# Patient Record
Sex: Female | Born: 1960 | Race: White | Hispanic: No | Marital: Single | Smoking: Never smoker
Health system: Southern US, Community
[De-identification: ages and names within clinical notes are randomized; demographics above are authoritative.]

## PROBLEM LIST (undated history)

## (undated) DIAGNOSIS — M810 Age-related osteoporosis without current pathological fracture: Secondary | ICD-10-CM

## (undated) DIAGNOSIS — E079 Disorder of thyroid, unspecified: Secondary | ICD-10-CM

## (undated) HISTORY — PX: ABDOMINAL HYSTERECTOMY: SHX81

## (undated) HISTORY — PX: LASIK: SHX215

## (undated) HISTORY — DX: Age-related osteoporosis without current pathological fracture: M81.0

## (undated) HISTORY — PX: FOOT SURGERY: SHX648

## (undated) HISTORY — PX: SHOULDER SURGERY: SHX246

---

## 2015-03-12 ENCOUNTER — Encounter (HOSPITAL_COMMUNITY): Payer: Self-pay | Admitting: Family Medicine

## 2015-03-12 ENCOUNTER — Emergency Department (HOSPITAL_COMMUNITY)
Admission: EM | Admit: 2015-03-12 | Discharge: 2015-03-12 | Discharge status: Against Medical Advice | Payer: Commercial Indemnity | Attending: Emergency Medicine | Admitting: Emergency Medicine

## 2015-03-12 DIAGNOSIS — R109 Unspecified abdominal pain: Secondary | ICD-10-CM | POA: Insufficient documentation

## 2015-03-12 HISTORY — DX: Disorder of thyroid, unspecified: E07.9

## 2015-03-12 NOTE — ED Notes (Signed)
Pt came to the desk and states that the chicken that was on her ulcer has dislodged and has come out. States that she feels fine and is going to go home. Denies difficulty breathing.

## 2015-03-12 NOTE — ED Notes (Signed)
Pt here for the feeling of something stuck in her esophagus. sts the same thing before with an ulcer. sts when she drinks water in comes back up. sts last time they had to put her to sleep and get the food out.

## 2016-02-20 ENCOUNTER — Encounter: Payer: Self-pay | Admitting: Podiatry

## 2016-02-20 ENCOUNTER — Ambulatory Visit (INDEPENDENT_AMBULATORY_CARE_PROVIDER_SITE_OTHER): Payer: Managed Care, Other (non HMO)

## 2016-02-20 ENCOUNTER — Ambulatory Visit (INDEPENDENT_AMBULATORY_CARE_PROVIDER_SITE_OTHER): Payer: Managed Care, Other (non HMO) | Admitting: Podiatry

## 2016-02-20 VITALS — BP 117/68 | HR 74 | Resp 16 | Ht 67.0 in | Wt 309.0 lb

## 2016-02-20 DIAGNOSIS — M79672 Pain in left foot: Secondary | ICD-10-CM | POA: Diagnosis not present

## 2016-02-20 DIAGNOSIS — M722 Plantar fascial fibromatosis: Secondary | ICD-10-CM

## 2016-02-20 DIAGNOSIS — M79671 Pain in right foot: Secondary | ICD-10-CM | POA: Diagnosis not present

## 2016-02-20 MED ORDER — TRIAMCINOLONE ACETONIDE 10 MG/ML IJ SUSP
10.0000 mg | Freq: Once | INTRAMUSCULAR | Status: AC
  Administered 2016-02-20: 10 mg

## 2016-02-20 MED ORDER — DICLOFENAC SODIUM 75 MG PO TBEC: 75.0000 mg | DELAYED_RELEASE_TABLET | Freq: Two times a day (BID) | ORAL | 2 refills | Status: DC

## 2016-02-20 NOTE — Progress Notes (Signed)
   Subjective:    Patient ID: Heather Farley, female    DOB: 1960/08/21, 55 y.o.   MRN: [id number]  HPI  Chief Complaint  Patient presents with  . Foot Pain    bilateral heels; "pt states pain is worse in the right" x a few mo       Review of Systems  All other systems reviewed and are negative.      Objective:   Physical Exam        Assessment & Plan:

## 2016-02-20 NOTE — Progress Notes (Signed)
Subjective:     Patient ID: Heather Farley, female   DOB: [birth year], 55 y.o.   MRN: [id number]  HPI patient states she's having a lot of pain in her heels right over left and it's been going on for a while and she's had previous history of condition with night splints that she's utilized in the past   Review of Systems  All other systems reviewed and are negative.      Objective:   Physical Exam  Constitutional: She is oriented to person, place, and time.  Cardiovascular: Intact distal pulses.   Genitourinary: Vaginal discharge: neurovascular status intact muscle strength adequate range of motion within normal limits with patient found to have exquisite discomfort plantar heel right over left with inflammation fluid medial band and moderate depression of the arch. Patient's found   Musculoskeletal: Normal range of motion.  Neurological: She is oriented to person, place, and time.  Skin: Skin is warm.  Nursing note and vitals reviewed.      Assessment:     Acute plantar fasciitis right over left of several months duration    Plan:     H&P x-rays reviewed and injected the plantar fascia bilateral 3 mg Kenalog 5 mg Xylocaine and dispensed night splint with instructions on usage and prescription for diclofenac 75 mg twice a day was dispensed and prescribed. Patient was given instructions on how to stretch properly will be seen back in 2 weeks to recheck  X-ray report was negative for signs of fracture did indicate large spur right over left

## 2016-02-20 NOTE — Patient Instructions (Signed)

## 2016-03-05 ENCOUNTER — Ambulatory Visit (INDEPENDENT_AMBULATORY_CARE_PROVIDER_SITE_OTHER): Payer: Managed Care, Other (non HMO) | Admitting: Podiatry

## 2016-03-05 ENCOUNTER — Encounter: Payer: Self-pay | Admitting: Podiatry

## 2016-03-05 DIAGNOSIS — M722 Plantar fascial fibromatosis: Secondary | ICD-10-CM

## 2016-03-05 MED ORDER — TRIAMCINOLONE ACETONIDE 10 MG/ML IJ SUSP
10.0000 mg | Freq: Once | INTRAMUSCULAR | Status: AC
  Administered 2016-03-05: 10 mg

## 2016-03-06 NOTE — Progress Notes (Signed)
Subjective:     Patient ID: Heather Farley, female   DOB: Nov 08, 1960, 55 y.o.   MRN: [id number]  HPI patient states I'm improved but still having discomfort   Review of Systems     Objective:   Physical Exam Neurovascular status intact with discomfort in the heel at the insertion tendon into the calcaneus with moderate improvement    Assessment:     Plantar fasciitis improved but still present    Plan:     Reinjected 3 mg Kenalog 5 mill grams Xylocaine advised on physical therapy ice and supportive shoe

## 2016-04-07 ENCOUNTER — Ambulatory Visit: Payer: Managed Care, Other (non HMO) | Admitting: Podiatry

## 2016-05-27 ENCOUNTER — Other Ambulatory Visit: Payer: Self-pay | Admitting: Gastroenterology

## 2016-05-27 DIAGNOSIS — R10814 Left lower quadrant abdominal tenderness: Secondary | ICD-10-CM

## 2016-05-28 ENCOUNTER — Ambulatory Visit
Admission: RE | Admit: 2016-05-28 | Discharge: 2016-05-28 | Disposition: A | Discharge status: Home / Self Care | Payer: Managed Care, Other (non HMO) | Source: Ambulatory Visit | Attending: Gastroenterology | Admitting: Gastroenterology

## 2016-05-28 DIAGNOSIS — R10814 Left lower quadrant abdominal tenderness: Secondary | ICD-10-CM

## 2016-05-28 MED ORDER — IOPAMIDOL (ISOVUE-300) INJECTION 61%
100.0000 mL | Freq: Once | INTRAVENOUS | Status: AC | PRN
  Administered 2016-05-28: 100 mL via INTRAVENOUS

## 2016-05-30 ENCOUNTER — Encounter: Payer: Self-pay | Admitting: Podiatry

## 2016-05-30 ENCOUNTER — Ambulatory Visit (INDEPENDENT_AMBULATORY_CARE_PROVIDER_SITE_OTHER): Payer: Managed Care, Other (non HMO) | Admitting: Podiatry

## 2016-05-30 VITALS — BP 108/63 | HR 87 | Resp 16

## 2016-05-30 DIAGNOSIS — M722 Plantar fascial fibromatosis: Secondary | ICD-10-CM | POA: Diagnosis not present

## 2016-05-30 MED ORDER — TRIAMCINOLONE ACETONIDE 10 MG/ML IJ SUSP
10.0000 mg | Freq: Once | INTRAMUSCULAR | Status: AC
  Administered 2016-05-30: 10 mg

## 2016-06-01 NOTE — Progress Notes (Signed)
Subjective:     Patient ID: Heather Farley, female   DOB: [birth year], 55 y.o.   MRN: [id number]  HPI patient states she's having a flareup in her heel   Review of Systems     Objective:   Physical Exam Neurovascular status intact with discomfort noted upon palpation to the plantar heel but it is doing okay overall    Assessment:     Plantar fasciitis right    Plan:     Injected the right plantar fashion 3 mg Kenalog 5 mill grams Xylocaine and instructed on physical therapy and reappoint to recheck

## 2016-06-25 ENCOUNTER — Ambulatory Visit (INDEPENDENT_AMBULATORY_CARE_PROVIDER_SITE_OTHER): Payer: Managed Care, Other (non HMO)

## 2016-06-25 DIAGNOSIS — M722 Plantar fascial fibromatosis: Secondary | ICD-10-CM

## 2016-06-25 NOTE — Patient Instructions (Signed)

## 2016-07-11 NOTE — Progress Notes (Signed)
Patient presents for orthotic pick up.  Verbal and written break in and wear instructions given.  Patient will follow up in 4 weeks if symptoms worsen or fail to improve. 

## 2016-07-21 ENCOUNTER — Ambulatory Visit: Payer: Managed Care, Other (non HMO) | Admitting: Podiatry

## 2016-07-24 ENCOUNTER — Encounter: Payer: Self-pay | Admitting: Podiatry

## 2016-07-24 ENCOUNTER — Ambulatory Visit (INDEPENDENT_AMBULATORY_CARE_PROVIDER_SITE_OTHER): Payer: Managed Care, Other (non HMO) | Admitting: Podiatry

## 2016-07-24 DIAGNOSIS — M722 Plantar fascial fibromatosis: Secondary | ICD-10-CM | POA: Diagnosis not present

## 2016-07-24 NOTE — Patient Instructions (Signed)
Pre-Operative Instructions  Congratulations, you have decided to take an important step to improving your quality of life.  You can be assured that the doctors of [facility] will be with you every step of the way.  1. Plan to be at the surgery center/hospital at least 1 (one) hour prior to your scheduled time unless otherwise directed by the surgical center/hospital staff.  You must have a responsible adult accompany you, remain during the surgery and drive you home.  Make sure you have directions to the surgical center/hospital and know how to get there on time. 2. For hospital based surgery you will need to obtain a history and physical form from your family physician within 1 month prior to the date of surgery- we will give you a form for you primary physician.  3. We make every effort to accommodate the date you request for surgery.  There are however, times where surgery dates or times have to be moved.  We will contact you as soon as possible if a change in schedule is required.   4. No Aspirin/Ibuprofen for one week before surgery.  If you are on aspirin, any non-steroidal anti-inflammatory medications (Mobic, Aleve, Ibuprofen) you should stop taking it 7 days prior to your surgery.  You make take Tylenol  For pain prior to surgery.  5. Medications- If you are taking daily heart and blood pressure medications, seizure, reflux, allergy, asthma, anxiety, pain or diabetes medications, make sure the surgery center/hospital is aware before the day of surgery so they may notify you which medications to take or avoid the day of surgery. 6. No food or drink after midnight the night before surgery unless directed otherwise by surgical center/hospital staff. 7. No alcoholic beverages 24 hours prior to surgery.  No smoking 24 hours prior to or 24 hours after surgery. 8. Wear loose pants or shorts- loose enough to fit over bandages, boots, and casts. 9. No slip on shoes, sneakers are best. 10. Bring  your boot with you to the surgery center/hospital.  Also bring crutches or a walker if your physician has prescribed it for you.  If you do not have this equipment, it will be provided for you after surgery. 11. If you have not been contracted by the surgery center/hospital by the day before your surgery, call to confirm the date and time of your surgery. 12. Leave-time from work may vary depending on the type of surgery you have.  Appropriate arrangements should be made prior to surgery with your employer. 13. Prescriptions will be provided immediately following surgery by your doctor.  Have these filled as soon as possible after surgery and take the medication as directed. 14. Remove nail polish on the operative foot. 15. Wash the night before surgery.  The night before surgery wash the foot and leg well with the antibacterial soap provided and water paying special attention to beneath the toenails and in between the toes.  Rinse thoroughly with water and dry well with a towel.  Perform this wash unless told not to do so by your physician.  Enclosed: 1 Ice pack (please put in freezer the night before surgery)   1 Hibiclens skin cleaner   Pre-op Instructions  If you have any questions regarding the instructions, do not hesitate to call our office.  [place]: [address] [place], [place] [address] [phone]  [place]: [address] [place], [place] [address] [phone]  [place]: [address]  [place], [place] [address] [phone]   Dr.   Nyisha Clippard DPM, Dr. Matthew Wagoner DPM, Dr. M. Todd Hyatt DPM, Dr. Titorya Stover DPM 

## 2016-07-24 NOTE — Progress Notes (Signed)
Subjective:     Patient ID: Heather Farley, female   DOB: 1960/07/17, 56 y.o.   MRN: [id number]  HPI patient states that both heels have been killing her with the left worse than the right with inflammation and fluid around the medial band. States that she's not receiving relief from the injection treatments and that she has worn orthotics as tried braces she's tried reduced activity and anti-inflammatories without relief and she is tired of the significant discomfort   Review of Systems     Objective:   Physical Exam Neurovascular status intact negative Homans sign was noted with patient found to have severe discomfort plantar fascial left and right heel at the insertional point of the tendon into the calcaneus with fluid buildup around the medial band. Patient states it is localized to this area and has been intense in its origin    Assessment:     Severe plantar fasciitis of a chronic nature heel left over right insertional point medial band    Plan:     H&P condition reviewed at great length. Due to long-standing nature and failure to respond to numerous conservative treatments patient has opted for surgical intervention. I allowed patient to read consent form for correction of left and I reviewed alternative treatments complications the fact there is no guarantee this will solve the problem and other complications that can occur with release of the plantar fascia. Patient wants surgery signed consent form and understands recovery can take approximate 6 months and is dispensed air fracture walker with all instructions on usage. Patient is [facility] and is encouraged to call with any questions

## 2016-07-30 ENCOUNTER — Encounter: Payer: Self-pay | Admitting: Podiatry

## 2016-07-30 DIAGNOSIS — M722 Plantar fascial fibromatosis: Secondary | ICD-10-CM | POA: Diagnosis not present

## 2016-08-01 ENCOUNTER — Telehealth: Payer: Self-pay

## 2016-08-01 NOTE — Telephone Encounter (Signed)
Poke with pt regarding post op status, she states her pain has increased today and she is regularly taking Rx pain medication. Advised her to loosen ace wrap, leaving gauze in place. Layer ibuprofen between Rx pain medication and tried to ween off pain medication as tolerated. Advised to continue with boot usage, ice and elevation. She verbalized understanding of all this and is to call with any acute symptom changes

## 2016-08-05 NOTE — Progress Notes (Signed)
DOS 01.31.2018 Endoscopic Release Med. Band Left Heel.

## 2016-08-07 ENCOUNTER — Ambulatory Visit (INDEPENDENT_AMBULATORY_CARE_PROVIDER_SITE_OTHER): Payer: Managed Care, Other (non HMO) | Admitting: Podiatry

## 2016-08-07 ENCOUNTER — Encounter: Payer: Self-pay | Admitting: Podiatry

## 2016-08-07 VITALS — Temp 97.1°F

## 2016-08-07 DIAGNOSIS — M722 Plantar fascial fibromatosis: Secondary | ICD-10-CM

## 2016-08-07 NOTE — Progress Notes (Signed)
She presents today for follow-up of her endoscopic plantar fasciotomy 1 week. She denies fever chills nausea vomiting muscle aches and pains. States it is doing very well. She presents today utilizing a cam walker. States that she has not been sleeping in a Cam Walker nor has she been sleeping in a night splint. She denies chest pain shortness breath.  Objective: Vital signs are stable alert and oriented 3. Pulses are strongly palpable. Neurologic sensorium is intact. Deep tendon reflexes are intact. Muscle strength is normal. Sutures are intact to the medial and lateral ports of the left heel. There is no signs of infection in that there is no erythema edema cellulitis drainage or odor. There is small dehiscence of the right incision site or I will have taken the stitches out today.  Assessment: Well-healing surgical foot left.  Plan: I placed her in a compression anklet and a Darco shoe will follow-up with her in a couple of weeks to see Dr. Charlsie Merlesregal.

## 2016-08-13 ENCOUNTER — Encounter: Payer: Self-pay | Admitting: Podiatry

## 2016-08-13 ENCOUNTER — Ambulatory Visit (INDEPENDENT_AMBULATORY_CARE_PROVIDER_SITE_OTHER): Payer: Self-pay | Admitting: Podiatry

## 2016-08-13 VITALS — BP 119/67 | HR 74 | Resp 16

## 2016-08-13 DIAGNOSIS — M722 Plantar fascial fibromatosis: Secondary | ICD-10-CM

## 2016-08-14 NOTE — Progress Notes (Signed)
Subjective:     Patient ID: Heather Farley, female   DOB: [birth year], 56 y.o.   MRN: [id number]  HPI patient presents stating I'm doing well with my left foot with minimal discomfort and wearing the boot without pain   Review of Systems     Objective:   Physical Exam Neurovascular status intact negative Homan sign was noted with patient's wound edges well coapted medial lateral side left heel    Assessment:     Doing well post endoscopic surgery left    Plan:     Stitches removed wound edges coapted well and advised a continued boot usage and stretch and reappoint to recheck 4 weeks or earlier if needed

## 2016-09-10 ENCOUNTER — Telehealth: Payer: Self-pay | Admitting: Podiatry

## 2016-09-10 NOTE — Telephone Encounter (Signed)
Pt called asking what other doctors could remove heel spurs.She asid Dr Charlsie Merlesegal did surgery on her plantar fascitis and said he could not remove at same time.Please advise

## 2016-09-11 NOTE — Telephone Encounter (Signed)
"  I want to have my heel spur taken off on my left foot.  Dr. Charlsie Merlesegal said he doesn't do that, he said he hasn't done that in 25 years.  He did surgery for plantar fasciitis on my right foot before.  I want the spur removed this time on my right foot.  I like Dr. Charlsie Merlesegal but I know what I want."  Normally surgery is not done to remove the heel spur.  Relief normally comes from doing surgery on the Plantar Fascia because it rubs the heel and the friction causes the spur to develop.  "I understand all that.  I just want to know if there is someone in your office that can do this surgery."  I will have to ask Dr. Charlsie Merlesegal who he recommends.  "I have been told this for two days.  I have not heard anything yet.  Ciria told me someone would call me back on yesterday but no one called.  I am not trying to give you all a hard time.  I respect Dr. Charlsie Merlesegal and have no problem against him.  I just want it done and if you all can't, I will go somewhere else."  I will personally give the message to Dr. Charlsie Merlesegal and give you a call back with a response.  "Okay, I'll be waiting."

## 2016-09-11 NOTE — Telephone Encounter (Signed)
I left patient a message that Dr. Charlsie Merlesegal does not do that procedure.  He said he can refer you to Dr. Ardelle AntonWagoner.  Please give us a call to schedule an appointment for a consultation with Dr. Ovid CurdMatthew Wagoner.

## 2017-09-02 ENCOUNTER — Other Ambulatory Visit: Payer: Self-pay | Admitting: Nurse Practitioner

## 2017-09-02 DIAGNOSIS — Z139 Encounter for screening, unspecified: Secondary | ICD-10-CM

## 2017-09-09 ENCOUNTER — Ambulatory Visit
Admission: RE | Admit: 2017-09-09 | Discharge: 2017-09-09 | Disposition: A | Discharge status: Home / Self Care | Payer: Managed Care, Other (non HMO) | Source: Ambulatory Visit | Attending: Nurse Practitioner | Admitting: Nurse Practitioner

## 2017-09-09 DIAGNOSIS — Z139 Encounter for screening, unspecified: Secondary | ICD-10-CM

## 2018-03-11 ENCOUNTER — Encounter (HOSPITAL_COMMUNITY): Payer: Self-pay

## 2018-03-11 ENCOUNTER — Ambulatory Visit (HOSPITAL_COMMUNITY)
Admission: EM | Admit: 2018-03-11 | Discharge: 2018-03-11 | Disposition: A | Discharge status: Home / Self Care | Payer: Managed Care, Other (non HMO) | Attending: Family Medicine | Admitting: Family Medicine

## 2018-03-11 DIAGNOSIS — K5732 Diverticulitis of large intestine without perforation or abscess without bleeding: Secondary | ICD-10-CM

## 2018-03-11 DIAGNOSIS — E039 Hypothyroidism, unspecified: Secondary | ICD-10-CM

## 2018-03-11 MED ORDER — METRONIDAZOLE 500 MG PO TABS: 500.0000 mg | ORAL_TABLET | Freq: Two times a day (BID) | ORAL | 0 refills | Status: DC

## 2018-03-11 MED ORDER — CIPROFLOXACIN HCL 500 MG PO TABS: 500.0000 mg | ORAL_TABLET | Freq: Two times a day (BID) | ORAL | 0 refills | Status: DC

## 2018-03-11 NOTE — ED Provider Notes (Signed)
[facility]    CSN: [id number] Arrival date & time: 03/11/18  0841     History   Chief Complaint Chief Complaint  Patient presents with  . Abdominal Pain    Left lower Side    HPI Tanay Massiah is a 57 y.o. female.   HPI  History of diverticulosis.  Has had diverticulitis.  Is here with LLQ pain for 3 days, acutely worse since midnight.  Nausea, no vomiting.  A few loose stools, no mucous or blood.  No fever or chills.  Has had a hysterectomy and appy.   No travel No new foods No exposure to sick contacts  Past Medical History:  Diagnosis Date  . Thyroid disease     Patient Active Problem List   Diagnosis Date Noted  . Hypothyroidism 03/11/2018    Past Surgical History:  Procedure Laterality Date  . ABDOMINAL HYSTERECTOMY      OB History   None      Home Medications    Prior to Admission medications   Medication Sig Start Date End Date Taking? Authorizing Provider  ciprofloxacin (CIPRO) 500 MG tablet Take 1 tablet (500 mg total) by mouth 2 (two) times daily. 03/11/18   Eustace Moore, MD  diclofenac (VOLTAREN) 75 MG EC tablet Take 1 tablet (75 mg total) by mouth 2 (two) times daily. 02/20/16   Lenn Sink, DPM  metroNIDAZOLE (FLAGYL) 500 MG tablet Take 1 tablet (500 mg total) by mouth 2 (two) times daily. 03/11/18   Eustace Moore, MD  NATURE-THROID 130 MG tablet Take 130 mg by mouth daily. 02/01/16   [provider]    Family History History reviewed. No pertinent family history.  Social History Social History   Tobacco Use  . Smoking status: Never Smoker  . Smokeless tobacco: Never Used  Substance Use Topics  . Alcohol use: No  . Drug use: No     Allergies   Chicken allergy; Soy allergy; Codeine; and Hydrocodone-acetaminophen   Review of Systems Review of Systems  Constitutional: Negative for chills and fever.  HENT: Negative for ear pain and sore throat.   Eyes: Negative for pain and visual disturbance.    Respiratory: Negative for cough and shortness of breath.   Cardiovascular: Negative for chest pain and palpitations.  Gastrointestinal: Positive for abdominal pain, diarrhea and nausea. Negative for blood in stool and vomiting.  Genitourinary: Negative for dysuria and hematuria.  Musculoskeletal: Negative for arthralgias and back pain.  Skin: Negative for color change and rash.  Neurological: Negative for seizures and syncope.  All other systems reviewed and are negative.    Physical Exam Triage Vital Signs ED Triage Vitals  Enc Vitals Group     BP 03/11/18 0902 (!) 158/89     Pulse Rate 03/11/18 0902 (!) 112     Resp 03/11/18 0902 18     Temp 03/11/18 0902 98.9 F (37.2 C)     Temp Source 03/11/18 0902 Oral     SpO2 03/11/18 0902 98 %     Weight --      Height --      Head Circumference --      Peak Flow --      Pain Score 03/11/18 0906 7     Pain Loc --      Pain Edu? --      Excl. in GC? --    No data found.  Updated Vital Signs BP (!) 158/89 (BP Location: Left Arm)  Pulse (!) 112   Temp 98.9 F (37.2 C) (Oral)   Resp 18   SpO2 98%      Physical Exam  Constitutional: She appears well-developed and well-nourished. No distress.  HENT:  Head: Normocephalic and atraumatic.  Mouth/Throat: Oropharynx is clear and moist.  Eyes: Pupils are equal, round, and reactive to light. Conjunctivae are normal.  Neck: Normal range of motion.  Cardiovascular: Normal rate, regular rhythm and normal heart sounds.  Pulmonary/Chest: Effort normal and breath sounds normal. No respiratory distress. She has no rales.  Abdominal: Soft. Bowel sounds are normal. She exhibits no distension. There is no hepatosplenomegaly. There is tenderness in the left lower quadrant. There is no rigidity, no rebound and no guarding.  Normal active bowel sounds.  Acute tenderness to deep palpation of the left lower quadrant.  No guarding or rebound.  Remainder of abdominal exam is normal.  No CVAT.  UA  normal.  Musculoskeletal: Normal range of motion. She exhibits no edema.  Neurological: She is alert.  Skin: Skin is warm and dry.  Psychiatric: She has a normal mood and affect. Her behavior is normal.     UC Treatments / Results  Labs (all labs ordered are listed, but only abnormal results are displayed) Labs Reviewed - No data to display  EKG None  Radiology No results found.  Procedures Procedures (including critical care time)  Medications Ordered in UC Medications - No data to display  Initial Impression / Assessment and Plan / UC Course  I have reviewed the triage vital signs and the nursing notes.  Pertinent labs & imaging results that were available during my care of the patient were reviewed by me and considered in my medical decision making (see chart for details).      Final Clinical Impressions(s) / UC Diagnoses   Final diagnoses:  Diverticulitis of colon     Discharge Instructions     For diverticulitis prevention eat a high fiber diet For current flare - take antibiotics for 5 days then assess your response.  May stop when you feel better Be sure to continue your probiotic Follow up with your PCP    ED Prescriptions    Medication Sig Dispense Auth. Provider   ciprofloxacin (CIPRO) 500 MG tablet Take 1 tablet (500 mg total) by mouth 2 (two) times daily. 20 tablet Eustace MooreNelson, Zahria Ding Sue, MD   metroNIDAZOLE (FLAGYL) 500 MG tablet Take 1 tablet (500 mg total) by mouth 2 (two) times daily. 20 tablet Eustace MooreNelson, Teddie Mehta Sue, MD     Controlled Substance Prescriptions [place] Controlled Substance Registry consulted? Not Applicable   Eustace MooreNelson, Tsuyako Jolley Sue, MD 03/11/18 1013

## 2018-03-11 NOTE — ED Triage Notes (Signed)
Pt presents with lower left middle side abdominal pain that hurts more with breathing out . Complains of no other symptoms.

## 2018-03-11 NOTE — Discharge Instructions (Signed)
For diverticulitis prevention eat a high fiber diet For current flare - take antibiotics for 5 days then assess your response.  May stop when you feel better Be sure to continue your probiotic Follow up with your PCP

## 2018-11-17 ENCOUNTER — Other Ambulatory Visit: Payer: Self-pay | Admitting: Nurse Practitioner

## 2018-11-17 DIAGNOSIS — Z1231 Encounter for screening mammogram for malignant neoplasm of breast: Secondary | ICD-10-CM

## 2018-11-19 ENCOUNTER — Ambulatory Visit
Admission: RE | Admit: 2018-11-19 | Discharge: 2018-11-19 | Disposition: A | Discharge status: Home / Self Care | Payer: Managed Care, Other (non HMO) | Source: Ambulatory Visit | Attending: Nurse Practitioner | Admitting: Nurse Practitioner

## 2018-11-19 ENCOUNTER — Other Ambulatory Visit: Payer: Self-pay

## 2018-11-19 DIAGNOSIS — Z1231 Encounter for screening mammogram for malignant neoplasm of breast: Secondary | ICD-10-CM

## 2018-11-24 ENCOUNTER — Other Ambulatory Visit: Payer: Self-pay

## 2018-11-24 ENCOUNTER — Ambulatory Visit (HOSPITAL_COMMUNITY)
Admission: EM | Admit: 2018-11-24 | Discharge: 2018-11-24 | Disposition: A | Discharge status: Home / Self Care | Payer: Managed Care, Other (non HMO) | Attending: Family Medicine | Admitting: Family Medicine

## 2018-11-24 ENCOUNTER — Encounter (HOSPITAL_COMMUNITY): Payer: Self-pay

## 2018-11-24 DIAGNOSIS — S50862A Insect bite (nonvenomous) of left forearm, initial encounter: Secondary | ICD-10-CM

## 2018-11-24 DIAGNOSIS — W57XXXA Bitten or stung by nonvenomous insect and other nonvenomous arthropods, initial encounter: Secondary | ICD-10-CM

## 2018-11-24 DIAGNOSIS — L918 Other hypertrophic disorders of the skin: Secondary | ICD-10-CM | POA: Diagnosis not present

## 2018-11-24 MED ORDER — TRIAMCINOLONE ACETONIDE 0.1 % EX CREA: 1.0000 "application " | TOPICAL_CREAM | Freq: Two times a day (BID) | CUTANEOUS | 0 refills | Status: DC

## 2018-11-24 NOTE — ED Triage Notes (Signed)
Patient presents to Urgent Care with complaints of itchy area on left forearm since 3 days ago. Patient reports she has done a lot of "Google" searching and is scared she has skin cancer.

## 2018-11-30 NOTE — ED Provider Notes (Signed)
[facility]   [id number] 11/24/18 Arrival Time: 1652  ASSESSMENT & PLAN:  1. Insect bite of left forearm, initial encounter   2. Inflamed skin tag    For L forearm: Meds ordered this encounter  Medications  . triamcinolone cream (KENALOG) 0.1 %    Sig: Apply 1 application topically 2 (two) times daily.    Dispense:  30 g    Refill:  0   Skin tag removal: Skin tag and surrounding skin cleaned with alcohol. 1 cc of 1% plain lidocaine for local anesthesia. Using #10 blade skin tag removed. No complications. Minimal bleeding; controlled with silver nitrate application. Bandaged. Simple wound care instructions. Declines pathology.  Will follow up with PCP or here if worsening or failing to improve as anticipated. Reviewed expectations re: course of current medical issues. Questions answered. Outlined signs and symptoms indicating need for more acute intervention. Patient verbalized understanding. After Visit Summary given.   SUBJECTIVE:  Heather Farley is a 58 y.o. female who presents with a skin complaint.   1) Location:"itchy area" on left forearm Onset: abrupt Duration: 3 days Associated pruritis? moderate Associated pain? none Progression: stable  Drainage? No  Known trigger? questions insect bite/sting New soaps/lotions/topicals/detergents/environmental exposures? No Contacts with similar? No Recent travel? No  Other associated symptoms: none Therapies tried thus far: none Arthralgia or myalgia? none Recent illness? none Fever? none No specific aggravating or alleviating factors reported.  2) Location: L posterior inferior neck; "something on my skin that gets really sore and inflamed sometimes" Onset: gradual Duration: inflamed and sore for the past several days Associated pruritis? none Associated pain? mild Progression: stable  Drainage? No  Feels that her shirt irritates area. Other associated symptoms: none Therapies tried thus far: none  Arthralgia or myalgia? none Fever? none  ROS: As per HPI. All other systems negative.   OBJECTIVE: Vitals:   11/24/18 1711 11/24/18 1713  BP: (!) 175/121 (!) 129/93  Pulse: (!) 18   Resp: 18   Temp: 98.6 F (37 C)   TempSrc: Oral   SpO2: 100%     General appearance: alert; no distress Lungs: clear to auscultation bilaterally Heart: regular rate and rhythm Extremities: no edema Skin: warm and dry; L forearm with approx 1 cm slight induration and erythema; no drainage or bleeding; no fluctuance; L posterior inferior neck with inflamed skin tag, with no signs of infection Psychological: alert and cooperative; normal mood and affect  Allergies  Allergen Reactions  . Chicken Allergy   . Soy Allergy   . Codeine Nausea And Vomiting  . Hydrocodone-Acetaminophen Itching    Face was flushed    Past Medical History:  Diagnosis Date  . Thyroid disease    Social History   Socioeconomic History  . Marital status: Single    Spouse name: Not on file  . Number of children: Not on file  . Years of education: Not on file  . Highest education level: Not on file  Occupational History  . Not on file  Social Needs  . Financial resource strain: Not on file  . Food insecurity:    Worry: Not on file    Inability: Not on file  . Transportation needs:    Medical: Not on file    Non-medical: Not on file  Tobacco Use  . Smoking status: Never Smoker  . Smokeless tobacco: Never Used  Substance and Sexual Activity  . Alcohol use: No  . Drug use: No  . Sexual activity: Not on file  Lifestyle  . Physical activity:    Days per week: Not on file    Minutes per session: Not on file  . Stress: Not on file  Relationships  . Social connections:    Talks on phone: Not on file    Gets together: Not on file    Attends religious service: Not on file    Active member of club or organization: Not on file    Attends meetings of clubs or organizations: Not on file    Relationship status: Not  on file  . Intimate partner violence:    Fear of current or ex partner: Not on file    Emotionally abused: Not on file    Physically abused: Not on file    Forced sexual activity: Not on file  Other Topics Concern  . Not on file  Social History Narrative  . Not on file   Family History  Problem Relation Age of Onset  . Healthy Mother   . Healthy Father    Past Surgical History:  Procedure Laterality Date  . ABDOMINAL HYSTERECTOMY       Mardella LaymanHagler, Brentney Goldbach, MD 11/30/18 1123

## 2019-04-18 ENCOUNTER — Ambulatory Visit: Payer: Self-pay

## 2019-04-18 ENCOUNTER — Other Ambulatory Visit: Payer: Self-pay

## 2019-04-18 DIAGNOSIS — Z021 Encounter for pre-employment examination: Secondary | ICD-10-CM

## 2019-04-18 NOTE — Progress Notes (Signed)
Pre-employment drug screen collected using [employer] chain of custody form for account # [id number]. Specimen # [id number].  AMD

## 2019-05-12 ENCOUNTER — Telehealth: Payer: Self-pay | Admitting: General Practice

## 2019-05-12 DIAGNOSIS — S8002XA Contusion of left knee, initial encounter: Secondary | ICD-10-CM | POA: Diagnosis not present

## 2019-05-12 DIAGNOSIS — S8001XA Contusion of right knee, initial encounter: Secondary | ICD-10-CM | POA: Diagnosis not present

## 2019-05-12 NOTE — Telephone Encounter (Signed)
Knee injury would like guidance. She hit her knee while moving on a baby gate. I told her about [facility]. Not work related.

## 2019-05-13 ENCOUNTER — Other Ambulatory Visit: Payer: 59

## 2019-05-13 ENCOUNTER — Other Ambulatory Visit: Payer: Self-pay

## 2019-05-13 DIAGNOSIS — N951 Menopausal and female climacteric states: Secondary | ICD-10-CM

## 2019-05-13 NOTE — Progress Notes (Addendum)
Presents with order from Tonie Griffith, FNP-C of [place] ([place], [place]) for the following labs: Progesterone [facility] Total Testosterone Estradiol CMP  Phone:  [phone] Fax:  [phone]  Could not get Tonie Griffith, FNP-C to pull up in Epic.  Put orders in under Heather Morale, PA-C (Interim Provider).  Heather Farley states she saw FNP-C Reese about 6 weeks ago & had pellets inserted in buttock & these labs are to follow-up on the medication.  Informed Heather Farley that we will fax her results to her provider at the above fax number.  AMD  Medical records requested from PCP in regards to requested labs as patient has not yet been seen in this office. Received and labs cleared for processing

## 2019-05-20 LAB — COMPREHENSIVE METABOLIC PANEL
ALT: 18 IU/L (ref 0–32)
AST: 15 IU/L (ref 0–40)
Albumin/Globulin Ratio: 1.5 (ref 1.2–2.2)
Albumin: 3.9 g/dL (ref 3.8–4.9)
Alkaline Phosphatase: 108 IU/L (ref 39–117)
BUN/Creatinine Ratio: 9 (ref 9–23)
BUN: 7 mg/dL (ref 6–24)
Bilirubin Total: 0.3 mg/dL (ref 0.0–1.2)
CO2: 23 mmol/L (ref 20–29)
Calcium: 8.9 mg/dL (ref 8.7–10.2)
Chloride: 107 mmol/L — ABNORMAL HIGH (ref 96–106)
Creatinine, Ser: 0.76 mg/dL (ref 0.57–1.00)
GFR calc Af Amer: 101 mL/min/{1.73_m2} (ref >59)
GFR calc non Af Amer: 88 mL/min/{1.73_m2} (ref >59)
Globulin, Total: 2.6 g/dL (ref 1.5–4.5)
Glucose: 91 mg/dL (ref 65–99)
Potassium: 4.2 mmol/L (ref 3.5–5.2)
Sodium: 143 mmol/L (ref 134–144)
Total Protein: 6.5 g/dL (ref 6.0–8.5)

## 2019-05-20 LAB — PROGESTERONE: Progesterone: 1.4 ng/mL

## 2019-05-20 LAB — TESTOSTERONE, TOTAL, LC/MS/MS: Testosterone, total: 286.1 ng/dL

## 2019-05-20 LAB — FOLLICLE STIMULATING HORMONE: FSH: 26.2 m[IU]/mL

## 2019-05-20 LAB — ESTRADIOL: Estradiol: 58.3 pg/mL

## 2019-06-02 ENCOUNTER — Telehealth: Payer: Self-pay | Admitting: Physician Assistant

## 2019-06-02 ENCOUNTER — Telehealth: Payer: Self-pay

## 2019-06-02 NOTE — Telephone Encounter (Signed)
Heather Farley is a new [employer] employee effective October 2020. Her medical care is carried out at [facility] , [address], [place], [place] ,  [address]    Phone [phone]  Fax [phone]  Paper orders were received from her Provider Tonie Griffith FNP-C to include Progesterone, FSH,Total Testosterone, Estradiol, CMP, which were drawn on 05/13/2019. Results were faxed to ordering provider as available. See scanned image .  It is a benefit to the patient as [employer] employee to have labs drawn here, but all questions about the results should be forwarded to the ordering provider.

## 2019-06-02 NOTE — Telephone Encounter (Signed)
Lab results (Progesterone, FSH, Total Testosterone, Estradiol, CMP) from paper order from Tonie Griffith, FNP-C of [facility] , [address], [place], [place], [place]  [address] faxed to the attention of Tonie Griffith, FNP-C at [phone] on 06/02/2019.  AMD

## 2019-08-08 DIAGNOSIS — E559 Vitamin D deficiency, unspecified: Secondary | ICD-10-CM | POA: Diagnosis not present

## 2019-08-08 DIAGNOSIS — R234 Changes in skin texture: Secondary | ICD-10-CM | POA: Diagnosis not present

## 2019-08-08 DIAGNOSIS — R635 Abnormal weight gain: Secondary | ICD-10-CM | POA: Diagnosis not present

## 2019-08-08 DIAGNOSIS — R5383 Other fatigue: Secondary | ICD-10-CM | POA: Diagnosis not present

## 2019-08-08 DIAGNOSIS — E039 Hypothyroidism, unspecified: Secondary | ICD-10-CM | POA: Diagnosis not present

## 2019-08-08 DIAGNOSIS — M2550 Pain in unspecified joint: Secondary | ICD-10-CM | POA: Diagnosis not present

## 2019-08-08 DIAGNOSIS — N951 Menopausal and female climacteric states: Secondary | ICD-10-CM | POA: Diagnosis not present

## 2019-11-14 ENCOUNTER — Other Ambulatory Visit: Payer: Self-pay | Admitting: Nurse Practitioner

## 2019-11-14 DIAGNOSIS — Z1231 Encounter for screening mammogram for malignant neoplasm of breast: Secondary | ICD-10-CM

## 2019-11-21 ENCOUNTER — Other Ambulatory Visit: Payer: Self-pay

## 2019-11-21 ENCOUNTER — Other Ambulatory Visit: Payer: Self-pay | Admitting: *Deleted

## 2019-11-21 ENCOUNTER — Ambulatory Visit
Admission: RE | Admit: 2019-11-21 | Discharge: 2019-11-21 | Disposition: A | Discharge status: Home / Self Care | Payer: 59 | Source: Ambulatory Visit | Attending: Nurse Practitioner | Admitting: Nurse Practitioner

## 2019-11-21 ENCOUNTER — Other Ambulatory Visit: Payer: Self-pay | Admitting: Registered Nurse

## 2019-11-21 DIAGNOSIS — Z1231 Encounter for screening mammogram for malignant neoplasm of breast: Secondary | ICD-10-CM

## 2020-02-07 DIAGNOSIS — E039 Hypothyroidism, unspecified: Secondary | ICD-10-CM | POA: Diagnosis not present

## 2020-02-07 DIAGNOSIS — R7309 Other abnormal glucose: Secondary | ICD-10-CM | POA: Diagnosis not present

## 2020-02-07 DIAGNOSIS — R5383 Other fatigue: Secondary | ICD-10-CM | POA: Diagnosis not present

## 2020-02-07 DIAGNOSIS — E559 Vitamin D deficiency, unspecified: Secondary | ICD-10-CM | POA: Diagnosis not present

## 2020-02-07 DIAGNOSIS — R195 Other fecal abnormalities: Secondary | ICD-10-CM | POA: Diagnosis not present

## 2020-02-07 DIAGNOSIS — M2550 Pain in unspecified joint: Secondary | ICD-10-CM | POA: Diagnosis not present

## 2020-02-07 DIAGNOSIS — N951 Menopausal and female climacteric states: Secondary | ICD-10-CM | POA: Diagnosis not present

## 2020-02-07 DIAGNOSIS — R635 Abnormal weight gain: Secondary | ICD-10-CM | POA: Diagnosis not present

## 2020-02-08 ENCOUNTER — Other Ambulatory Visit: Payer: 59

## 2020-02-23 DIAGNOSIS — Z20828 Contact with and (suspected) exposure to other viral communicable diseases: Secondary | ICD-10-CM | POA: Diagnosis not present

## 2020-05-04 ENCOUNTER — Ambulatory Visit: Payer: Self-pay

## 2020-05-04 DIAGNOSIS — Z23 Encounter for immunization: Secondary | ICD-10-CM

## 2020-07-09 ENCOUNTER — Other Ambulatory Visit: Payer: Self-pay

## 2020-07-09 DIAGNOSIS — Z1152 Encounter for screening for COVID-19: Secondary | ICD-10-CM

## 2020-07-09 NOTE — Progress Notes (Signed)
Presents to [facility] for outdoor specimen collection for covid test.  Exposure to someone who tested positive on 07/05/20.  Vaccinated with booster  Asymptomatic  Has Mychart  AMD

## 2020-07-11 LAB — SARS-COV-2, NAA 2 DAY TAT

## 2020-07-11 LAB — NOVEL CORONAVIRUS, NAA: SARS-CoV-2, NAA: NOT DETECTED

## 2020-07-31 DIAGNOSIS — E559 Vitamin D deficiency, unspecified: Secondary | ICD-10-CM | POA: Diagnosis not present

## 2020-07-31 DIAGNOSIS — E538 Deficiency of other specified B group vitamins: Secondary | ICD-10-CM | POA: Insufficient documentation

## 2020-07-31 DIAGNOSIS — Z0000 Encounter for general adult medical examination without abnormal findings: Secondary | ICD-10-CM | POA: Diagnosis not present

## 2020-07-31 DIAGNOSIS — Z1211 Encounter for screening for malignant neoplasm of colon: Secondary | ICD-10-CM | POA: Diagnosis not present

## 2020-08-23 ENCOUNTER — Other Ambulatory Visit: Payer: Self-pay | Admitting: Family Medicine

## 2020-08-23 DIAGNOSIS — G5763 Lesion of plantar nerve, bilateral lower limbs: Secondary | ICD-10-CM

## 2020-08-23 DIAGNOSIS — M79671 Pain in right foot: Secondary | ICD-10-CM

## 2020-08-29 ENCOUNTER — Other Ambulatory Visit: Payer: Self-pay

## 2020-08-29 ENCOUNTER — Ambulatory Visit
Admission: RE | Admit: 2020-08-29 | Discharge: 2020-08-29 | Disposition: A | Discharge status: Home / Self Care | Payer: 59 | Source: Ambulatory Visit | Attending: Family Medicine | Admitting: Family Medicine

## 2020-08-29 DIAGNOSIS — M79672 Pain in left foot: Secondary | ICD-10-CM | POA: Insufficient documentation

## 2020-08-29 DIAGNOSIS — G5763 Lesion of plantar nerve, bilateral lower limbs: Secondary | ICD-10-CM | POA: Diagnosis not present

## 2020-08-29 DIAGNOSIS — M79671 Pain in right foot: Secondary | ICD-10-CM | POA: Diagnosis not present

## 2020-09-10 DIAGNOSIS — M79604 Pain in right leg: Secondary | ICD-10-CM | POA: Diagnosis not present

## 2020-09-10 DIAGNOSIS — M25561 Pain in right knee: Secondary | ICD-10-CM | POA: Diagnosis not present

## 2020-09-10 DIAGNOSIS — M79605 Pain in left leg: Secondary | ICD-10-CM | POA: Diagnosis not present

## 2020-09-10 DIAGNOSIS — M25562 Pain in left knee: Secondary | ICD-10-CM | POA: Diagnosis not present

## 2020-10-26 ENCOUNTER — Other Ambulatory Visit: Payer: Self-pay | Admitting: Family Medicine

## 2020-10-26 DIAGNOSIS — G8929 Other chronic pain: Secondary | ICD-10-CM

## 2020-10-26 DIAGNOSIS — M25562 Pain in left knee: Secondary | ICD-10-CM

## 2020-10-26 DIAGNOSIS — M79604 Pain in right leg: Secondary | ICD-10-CM

## 2020-10-26 DIAGNOSIS — M79605 Pain in left leg: Secondary | ICD-10-CM

## 2020-10-26 DIAGNOSIS — M79671 Pain in right foot: Secondary | ICD-10-CM

## 2020-10-31 ENCOUNTER — Other Ambulatory Visit: Payer: Self-pay | Admitting: Family Medicine

## 2020-10-31 DIAGNOSIS — M25561 Pain in right knee: Secondary | ICD-10-CM

## 2020-11-08 ENCOUNTER — Ambulatory Visit: Payer: 59

## 2020-11-20 ENCOUNTER — Other Ambulatory Visit: Payer: Self-pay | Admitting: Family Medicine

## 2020-11-20 DIAGNOSIS — M79671 Pain in right foot: Secondary | ICD-10-CM

## 2020-11-20 DIAGNOSIS — M79604 Pain in right leg: Secondary | ICD-10-CM

## 2020-11-20 DIAGNOSIS — G8929 Other chronic pain: Secondary | ICD-10-CM

## 2020-11-21 ENCOUNTER — Other Ambulatory Visit: Payer: Self-pay | Admitting: Family Medicine

## 2020-11-21 DIAGNOSIS — M79671 Pain in right foot: Secondary | ICD-10-CM

## 2020-11-21 DIAGNOSIS — M79604 Pain in right leg: Secondary | ICD-10-CM

## 2020-11-29 ENCOUNTER — Other Ambulatory Visit: Payer: Self-pay | Admitting: Family Medicine

## 2020-11-29 DIAGNOSIS — Z1231 Encounter for screening mammogram for malignant neoplasm of breast: Secondary | ICD-10-CM

## 2020-12-02 ENCOUNTER — Ambulatory Visit
Admission: RE | Admit: 2020-12-02 | Discharge: 2020-12-02 | Disposition: A | Discharge status: Home / Self Care | Payer: 59 | Source: Ambulatory Visit | Attending: Family Medicine | Admitting: Family Medicine

## 2020-12-02 ENCOUNTER — Other Ambulatory Visit: Payer: Self-pay

## 2020-12-02 DIAGNOSIS — M25562 Pain in left knee: Secondary | ICD-10-CM

## 2020-12-02 DIAGNOSIS — M79604 Pain in right leg: Secondary | ICD-10-CM

## 2020-12-02 DIAGNOSIS — M25561 Pain in right knee: Secondary | ICD-10-CM

## 2020-12-02 DIAGNOSIS — M79671 Pain in right foot: Secondary | ICD-10-CM

## 2020-12-02 DIAGNOSIS — M79672 Pain in left foot: Secondary | ICD-10-CM | POA: Diagnosis not present

## 2020-12-02 DIAGNOSIS — M7989 Other specified soft tissue disorders: Secondary | ICD-10-CM | POA: Diagnosis not present

## 2020-12-02 DIAGNOSIS — M79605 Pain in left leg: Secondary | ICD-10-CM

## 2020-12-02 DIAGNOSIS — G8929 Other chronic pain: Secondary | ICD-10-CM

## 2020-12-02 MED ORDER — GADOBENATE DIMEGLUMINE 529 MG/ML IV SOLN
20.0000 mL | Freq: Once | INTRAVENOUS | Status: AC | PRN
  Administered 2020-12-02: 20 mL via INTRAVENOUS

## 2020-12-05 ENCOUNTER — Other Ambulatory Visit: Payer: 59

## 2020-12-05 ENCOUNTER — Inpatient Hospital Stay: Admission: RE | Admit: 2020-12-05 | Payer: 59 | Source: Ambulatory Visit

## 2020-12-26 ENCOUNTER — Ambulatory Visit
Admission: RE | Admit: 2020-12-26 | Discharge: 2020-12-26 | Disposition: A | Discharge status: Home / Self Care | Payer: 59 | Source: Ambulatory Visit | Attending: Family Medicine | Admitting: Family Medicine

## 2020-12-26 ENCOUNTER — Other Ambulatory Visit: Payer: Self-pay

## 2020-12-26 DIAGNOSIS — Z1231 Encounter for screening mammogram for malignant neoplasm of breast: Secondary | ICD-10-CM | POA: Insufficient documentation

## 2021-01-31 ENCOUNTER — Ambulatory Visit (HOSPITAL_COMMUNITY)
Admission: RE | Admit: 2021-01-31 | Discharge: 2021-01-31 | Disposition: A | Discharge status: Home / Self Care | Payer: 59 | Source: Ambulatory Visit | Attending: Family Medicine | Admitting: Family Medicine

## 2021-01-31 DIAGNOSIS — M79604 Pain in right leg: Secondary | ICD-10-CM

## 2021-01-31 DIAGNOSIS — M79671 Pain in right foot: Secondary | ICD-10-CM

## 2021-01-31 DIAGNOSIS — R600 Localized edema: Secondary | ICD-10-CM | POA: Diagnosis not present

## 2021-01-31 DIAGNOSIS — G8929 Other chronic pain: Secondary | ICD-10-CM

## 2021-01-31 DIAGNOSIS — M25461 Effusion, right knee: Secondary | ICD-10-CM | POA: Diagnosis not present

## 2021-01-31 DIAGNOSIS — M79605 Pain in left leg: Secondary | ICD-10-CM | POA: Diagnosis not present

## 2021-01-31 DIAGNOSIS — M25561 Pain in right knee: Secondary | ICD-10-CM | POA: Insufficient documentation

## 2021-01-31 DIAGNOSIS — M25562 Pain in left knee: Secondary | ICD-10-CM

## 2021-01-31 DIAGNOSIS — M79672 Pain in left foot: Secondary | ICD-10-CM | POA: Insufficient documentation

## 2021-01-31 MED ORDER — GADOBUTROL 1 MMOL/ML IV SOLN
10.0000 mL | Freq: Once | INTRAVENOUS | Status: AC | PRN
  Administered 2021-01-31: 10 mL via INTRAVENOUS

## 2021-02-04 ENCOUNTER — Ambulatory Visit: Payer: 59

## 2021-02-15 DIAGNOSIS — M7731 Calcaneal spur, right foot: Secondary | ICD-10-CM | POA: Diagnosis not present

## 2021-02-15 DIAGNOSIS — M79671 Pain in right foot: Secondary | ICD-10-CM | POA: Diagnosis not present

## 2021-02-15 DIAGNOSIS — M7732 Calcaneal spur, left foot: Secondary | ICD-10-CM | POA: Diagnosis not present

## 2021-02-15 DIAGNOSIS — M79672 Pain in left foot: Secondary | ICD-10-CM | POA: Diagnosis not present

## 2021-02-15 DIAGNOSIS — M722 Plantar fascial fibromatosis: Secondary | ICD-10-CM | POA: Diagnosis not present

## 2021-02-27 DIAGNOSIS — M170 Bilateral primary osteoarthritis of knee: Secondary | ICD-10-CM | POA: Diagnosis not present

## 2021-02-27 DIAGNOSIS — M25561 Pain in right knee: Secondary | ICD-10-CM | POA: Diagnosis not present

## 2021-02-27 DIAGNOSIS — M25562 Pain in left knee: Secondary | ICD-10-CM | POA: Diagnosis not present

## 2021-03-14 DIAGNOSIS — M722 Plantar fascial fibromatosis: Secondary | ICD-10-CM | POA: Diagnosis not present

## 2021-03-14 DIAGNOSIS — M778 Other enthesopathies, not elsewhere classified: Secondary | ICD-10-CM | POA: Diagnosis not present

## 2021-04-22 DIAGNOSIS — M722 Plantar fascial fibromatosis: Secondary | ICD-10-CM | POA: Diagnosis not present

## 2021-04-25 ENCOUNTER — Other Ambulatory Visit: Payer: Self-pay

## 2021-04-25 DIAGNOSIS — R059 Cough, unspecified: Secondary | ICD-10-CM

## 2021-04-25 DIAGNOSIS — J029 Acute pharyngitis, unspecified: Secondary | ICD-10-CM

## 2021-04-25 NOTE — Progress Notes (Signed)
S/Sx started Friday: Cough - productive Holohan phlegm & she's seen some blood in it Sore throat Left ear popping Sinus Pain & Pressure  Taking OTC cough suppressant.  AMD

## 2021-04-26 ENCOUNTER — Other Ambulatory Visit: Payer: Self-pay | Admitting: Physician Assistant

## 2021-04-26 LAB — SARS-COV-2, NAA 2 DAY TAT

## 2021-04-26 LAB — NOVEL CORONAVIRUS, NAA: SARS-CoV-2, NAA: NOT DETECTED

## 2021-04-26 MED ORDER — AZITHROMYCIN 250 MG PO TABS: ORAL_TABLET | ORAL | 0 refills | Status: AC

## 2021-04-26 MED ORDER — FEXOFENADINE-PSEUDOEPHED ER 60-120 MG PO TB12
1.0000 | ORAL_TABLET | Freq: Two times a day (BID) | ORAL | 0 refills | Status: DC
Start: 2021-04-26 — End: 2021-08-21

## 2021-04-26 MED ORDER — BENZONATATE 200 MG PO CAPS: 200.0000 mg | ORAL_CAPSULE | Freq: Three times a day (TID) | ORAL | 0 refills | Status: DC | PRN

## 2021-04-26 MED ORDER — METHYLPREDNISOLONE 4 MG PO TBPK: ORAL_TABLET | ORAL | 0 refills | Status: DC

## 2021-04-29 DIAGNOSIS — M722 Plantar fascial fibromatosis: Secondary | ICD-10-CM | POA: Diagnosis not present

## 2021-05-02 DIAGNOSIS — M722 Plantar fascial fibromatosis: Secondary | ICD-10-CM | POA: Diagnosis not present

## 2021-05-07 DIAGNOSIS — M722 Plantar fascial fibromatosis: Secondary | ICD-10-CM | POA: Diagnosis not present

## 2021-05-09 DIAGNOSIS — M722 Plantar fascial fibromatosis: Secondary | ICD-10-CM | POA: Diagnosis not present

## 2021-05-14 DIAGNOSIS — M722 Plantar fascial fibromatosis: Secondary | ICD-10-CM | POA: Diagnosis not present

## 2021-05-16 DIAGNOSIS — M722 Plantar fascial fibromatosis: Secondary | ICD-10-CM | POA: Diagnosis not present

## 2021-05-28 DIAGNOSIS — M722 Plantar fascial fibromatosis: Secondary | ICD-10-CM | POA: Diagnosis not present

## 2021-05-31 ENCOUNTER — Ambulatory Visit: Payer: Self-pay

## 2021-05-31 ENCOUNTER — Other Ambulatory Visit: Payer: Self-pay

## 2021-05-31 NOTE — Progress Notes (Signed)
Flucelvax  quadrivalent EGG FREE Influenza vaccine. LEFT ARM. EXP: [id number]

## 2021-06-10 DIAGNOSIS — M722 Plantar fascial fibromatosis: Secondary | ICD-10-CM | POA: Diagnosis not present

## 2021-06-14 ENCOUNTER — Ambulatory Visit: Payer: 59 | Attending: Internal Medicine

## 2021-06-14 ENCOUNTER — Other Ambulatory Visit: Payer: Self-pay

## 2021-06-14 DIAGNOSIS — Z23 Encounter for immunization: Secondary | ICD-10-CM

## 2021-06-14 MED ORDER — PFIZER COVID-19 VAC BIVALENT 30 MCG/0.3ML IM SUSP
INTRAMUSCULAR | 0 refills | Status: DC
  Filled 2021-06-14: qty 0.3, 1d supply, fill #0

## 2021-06-14 NOTE — Progress Notes (Signed)
° °  Covid-19 Vaccination Clinic  Name:  Heather Farley    MRN: [id number] DOB: 19-May-1961  06/14/2021  [name] was observed post Covid-19 immunization for 15 minutes without incident. She was provided with Vaccine Information Sheet and instruction to access the V-Safe system.   [name] was instructed to call 911 with any severe reactions post vaccine: Difficulty breathing  Swelling of face and throat  A fast heartbeat  A bad rash all over body  Dizziness and weakness   Immunizations Administered     Name Date Dose VIS Date Route   Pfizer Covid-19 Vaccine Bivalent Booster 06/14/2021  3:25 PM 0.3 mL 02/27/2021 Intramuscular   Manufacturer: [employer], [employer]   Lot: [id number]   NDC: [phone]       Covid-19 Vaccination Clinic  Name:  Heather Farley    MRN: [id number] DOB: 1961-06-15  06/14/2021  [name] was observed post Covid-19 immunization for 15 minutes without incident. She was provided with Vaccine Information Sheet and instruction to access the V-Safe system.   [name] was instructed to call 911 with any severe reactions post vaccine: Difficulty breathing  Swelling of face and throat  A fast heartbeat  A bad rash all over body  Dizziness and weakness   Immunizations Administered     Name Date Dose VIS Date Route   Pfizer Covid-19 Vaccine Bivalent Booster 06/14/2021  3:25 PM 0.3 mL 02/27/2021 Intramuscular   Manufacturer: [employer], [employer]   Lot: [id number]   NDC: [phone]

## 2021-06-17 DIAGNOSIS — M722 Plantar fascial fibromatosis: Secondary | ICD-10-CM | POA: Diagnosis not present

## 2021-07-12 DIAGNOSIS — M722 Plantar fascial fibromatosis: Secondary | ICD-10-CM | POA: Diagnosis not present

## 2021-07-19 DIAGNOSIS — M722 Plantar fascial fibromatosis: Secondary | ICD-10-CM | POA: Diagnosis not present

## 2021-07-26 DIAGNOSIS — M722 Plantar fascial fibromatosis: Secondary | ICD-10-CM | POA: Diagnosis not present

## 2021-08-09 DIAGNOSIS — M722 Plantar fascial fibromatosis: Secondary | ICD-10-CM | POA: Diagnosis not present

## 2021-08-14 ENCOUNTER — Ambulatory Visit: Payer: Self-pay

## 2021-08-14 ENCOUNTER — Other Ambulatory Visit: Payer: Self-pay

## 2021-08-14 DIAGNOSIS — Z0000 Encounter for general adult medical examination without abnormal findings: Secondary | ICD-10-CM

## 2021-08-14 DIAGNOSIS — M722 Plantar fascial fibromatosis: Secondary | ICD-10-CM | POA: Diagnosis not present

## 2021-08-14 LAB — POCT URINALYSIS DIPSTICK
Bilirubin, UA: NEGATIVE
Glucose, UA: NEGATIVE
Ketones, UA: NEGATIVE
Leukocytes, UA: NEGATIVE
Nitrite, UA: NEGATIVE
Protein, UA: NEGATIVE
Spec Grav, UA: 1.015 (ref 1.010–1.025)
Urobilinogen, UA: 0.2 E.U./dL
pH, UA: 6.5 (ref 5.0–8.0)

## 2021-08-14 NOTE — Progress Notes (Signed)
08/21/21 annual physical scheduled.

## 2021-08-15 LAB — CMP12+LP+TP+TSH+6AC+CBC/D/PLT
ALT: 14 IU/L (ref 0–32)
AST: 15 IU/L (ref 0–40)
Albumin/Globulin Ratio: 1.2 (ref 1.2–2.2)
Albumin: 3.7 g/dL — ABNORMAL LOW (ref 3.8–4.9)
Alkaline Phosphatase: 127 IU/L — ABNORMAL HIGH (ref 44–121)
BUN/Creatinine Ratio: 9 (ref 9–23)
BUN: 8 mg/dL (ref 6–24)
Basophils Absolute: 0 10*3/uL (ref 0.0–0.2)
Basos: 1 %
Bilirubin Total: 0.3 mg/dL (ref 0.0–1.2)
Calcium: 9.1 mg/dL (ref 8.7–10.2)
Chloride: 104 mmol/L (ref 96–106)
Chol/HDL Ratio: 4.4 ratio (ref 0.0–4.4)
Cholesterol, Total: 169 mg/dL (ref 100–199)
Creatinine, Ser: 0.89 mg/dL (ref 0.57–1.00)
EOS (ABSOLUTE): 0.4 10*3/uL (ref 0.0–0.4)
Eos: 7 %
Estimated CHD Risk: 1.1 times avg. — ABNORMAL HIGH (ref 0.0–1.0)
Free Thyroxine Index: 2.5 (ref 1.2–4.9)
GGT: 21 IU/L (ref 0–60)
Globulin, Total: 3.1 g/dL (ref 1.5–4.5)
Glucose: 100 mg/dL — ABNORMAL HIGH (ref 70–99)
HDL: 38 mg/dL — ABNORMAL LOW (ref >39)
Hematocrit: 43 % (ref 34.0–46.6)
Hemoglobin: 13.7 g/dL (ref 11.1–15.9)
Immature Grans (Abs): 0 10*3/uL (ref 0.0–0.1)
Immature Granulocytes: 0 %
Iron: 65 ug/dL (ref 27–159)
LDH: 185 IU/L (ref 119–226)
LDL Chol Calc (NIH): 112 mg/dL — ABNORMAL HIGH (ref 0–99)
Lymphocytes Absolute: 1.9 10*3/uL (ref 0.7–3.1)
Lymphs: 32 %
MCH: 26.2 pg — ABNORMAL LOW (ref 26.6–33.0)
MCHC: 31.9 g/dL (ref 31.5–35.7)
MCV: 82 fL (ref 79–97)
Monocytes Absolute: 0.4 10*3/uL (ref 0.1–0.9)
Monocytes: 6 %
Neutrophils Absolute: 3.4 10*3/uL (ref 1.4–7.0)
Neutrophils: 54 %
Phosphorus: 3.5 mg/dL (ref 3.0–4.3)
Platelets: 227 10*3/uL (ref 150–450)
Potassium: 4.7 mmol/L (ref 3.5–5.2)
RBC: 5.23 x10E6/uL (ref 3.77–5.28)
RDW: 13.1 % (ref 11.7–15.4)
Sodium: 143 mmol/L (ref 134–144)
T3 Uptake Ratio: 28 % (ref 24–39)
T4, Total: 8.8 ug/dL (ref 4.5–12.0)
TSH: 0.086 u[IU]/mL — ABNORMAL LOW (ref 0.450–4.500)
Total Protein: 6.8 g/dL (ref 6.0–8.5)
Triglycerides: 106 mg/dL (ref 0–149)
Uric Acid: 5.3 mg/dL (ref 3.0–7.2)
VLDL Cholesterol Cal: 19 mg/dL (ref 5–40)
WBC: 6.1 10*3/uL (ref 3.4–10.8)
eGFR: 75 mL/min/{1.73_m2} (ref >59)

## 2021-08-15 LAB — VITAMIN B12: Vitamin B-12: >2000 pg/mL — ABNORMAL HIGH (ref 232–1245)

## 2021-08-15 LAB — VITAMIN D 25 HYDROXY (VIT D DEFICIENCY, FRACTURES): Vit D, 25-Hydroxy: 68.3 ng/mL (ref 30.0–100.0)

## 2021-08-20 DIAGNOSIS — M722 Plantar fascial fibromatosis: Secondary | ICD-10-CM | POA: Diagnosis not present

## 2021-08-21 ENCOUNTER — Ambulatory Visit: Payer: Self-pay | Admitting: Physician Assistant

## 2021-08-21 ENCOUNTER — Encounter: Payer: Self-pay | Admitting: Physician Assistant

## 2021-08-21 ENCOUNTER — Other Ambulatory Visit: Payer: Self-pay

## 2021-08-21 VITALS — BP 145/96 | HR 97 | Temp 97.6°F | Resp 16 | Ht 67.5 in | Wt 326.0 lb

## 2021-08-21 DIAGNOSIS — E039 Hypothyroidism, unspecified: Secondary | ICD-10-CM

## 2021-08-21 DIAGNOSIS — Z0000 Encounter for general adult medical examination without abnormal findings: Secondary | ICD-10-CM

## 2021-08-21 DIAGNOSIS — E063 Autoimmune thyroiditis: Secondary | ICD-10-CM

## 2021-08-21 MED ORDER — BENZONATATE 200 MG PO CAPS: 200.0000 mg | ORAL_CAPSULE | Freq: Two times a day (BID) | ORAL | 0 refills | Status: DC | PRN

## 2021-08-21 NOTE — Progress Notes (Signed)
[facility]  ____________________________________________   None    (approximate)  I have reviewed the triage vital signs and the nursing notes.   HISTORY  Chief Complaint Annual Exam    HPI Heather Farley is a 61 y.o. female patient presents for annual physical exam.  Patient was concerned for poor control hypothyroidism.  Patient also complain of chronic right knee pain.  Patient is requesting consult endocrinology to discuss Hashimoto's disease and change of  medication for condition.  Patient currently taking Armour at 240 mg.  Patient states recommended to have knee surgery but is deferred secondary to morbid obesity.         Past Medical History:  Diagnosis Date   Thyroid disease     Patient Active Problem List   Diagnosis Date Noted   Hypothyroidism 03/11/2018    Past Surgical History:  Procedure Laterality Date   ABDOMINAL HYSTERECTOMY      Prior to Admission medications   Medication Sig Start Date End Date Taking? Authorizing Provider  Cholecalciferol 125 MCG (5000 UT) TABS Take by mouth. 11/19/20  Yes [provider]  Menaquinone-7 (VITAMIN K2 PO) Take by mouth.   Yes [provider]  Probiotic Product (UP4 PROBIOTICS PO) Take by mouth.   Yes [provider]  Specialty Vitamins Products (ADRENALIV) CAPS Take by mouth. 11/19/20  Yes [provider]  thyroid (ARMOUR) 240 MG tablet Take 130 mg by mouth daily.   Yes [provider]    Allergies Chicken allergy, Codeine, Cayenne, Soy allergy, Hydrocodone-acetaminophen, and Hydrocodone-acetaminophen  Family History  Problem Relation Age of Onset   Healthy Mother    Healthy Father     Social History Social History   Tobacco Use   Smoking status: Never   Smokeless tobacco: Never  Vaping Use   Vaping Use: Never used  Substance Use Topics   Alcohol use: No   Drug use: No    Review of Systems Constitutional: No fever/chills Eyes: No visual  changes. ENT: No sore throat. Cardiovascular: Denies chest pain. Respiratory: Denies shortness of breath.  Nonproductive cough Gastrointestinal: No abdominal pain.  No nausea, no vomiting.  No diarrhea.  No constipation. Genitourinary: Negative for dysuria. Musculoskeletal: Chronic right knee pain. Skin: Negative for rash. Neurological: Negative for headaches, focal weakness or numbness. Endocrine: Hypothyroidism Allergic/Immunilogical: Chicken and codeine products. ____________________________________________   PHYSICAL EXAM:  VITAL SIGNS: Temperature 97.6, pulse 97, BP 145/96.  Patient weighs 309 pounds. Constitutional: Alert and oriented. Well appearing and in no acute distress. Eyes: Conjunctivae are normal. PERRL. EOMI. Head: Atraumatic. Nose: No congestion/rhinnorhea. Mouth/Throat: Mucous membranes are moist.  Oropharynx non-erythematous. Neck: No stridor.  No cervical spine tenderness to palpation.  Decreased range of motion with right lateral movements. Hematological/Lymphatic/Immunilogical: No cervical lymphadenopathy. Cardiovascular: Normal rate, regular rhythm. Grossly normal heart sounds.  Good peripheral circulation. Respiratory: Normal respiratory effort.  No retractions. Lungs CTAB. Gastrointestinal: Soft and nontender.  distention secondary to body habitus. No abdominal bruits. No CVA tenderness. Genitourinary: Deferred Musculoskeletal: No lower extremity tenderness nor edema.  No joint effusions. Neurologic:  Normal speech and language. No gross focal neurologic deficits are appreciated. No gait instability. Skin:  Skin is warm, dry and intact. No rash noted. Psychiatric: Mood and affect are normal. Speech and behavior are normal.  ____________________________________________   LABS  __0 Result Notes     Component Ref Range & Units 7 d ago  Color, UA  yellow   Clarity, UA  clear   Glucose,  UA Negative Negative   Bilirubin, UA  negative   Ketones, UA   negative   Spec Grav, UA 1.010 - 1.025 1.015   Blood, UA  Trace   Comment: -+  pH, UA 5.0 - 8.0 6.5   Protein, UA Negative Negative   Urobilinogen, UA 0.2 or 1.0 E.U./dL 0.2   Nitrite, UA  negative   Leukocytes, UA Negative Negative   Appearance  medium   Odor           Specimen Collected: 08/14/21 09:02 Last Resulted: 08/14/21 09:02      Lab Flowsheet    Order Details    View Encounter    Lab and Collection Details    Routing    Result History    View Encounter Conversation        Result Care Coordination   Patient Communication   Add Comments   Seen Back to Top       Other Results from 08/14/2021   Contains abnormal data CMP12+LP+TP+TSH+6AC+CBC/D/Plt Order: [id number] Status: Final result    Visible to patient: Yes (not seen)    Next appt: None    Dx: Routine adult health maintenance    0 Result Notes      Component Ref Range & Units 7 d ago 2 yr ago  Glucose 70 - 99 mg/dL 100 High   91 R   Uric Acid 3.0 - 7.2 mg/dL 5.3    Comment:            Therapeutic target for gout patients: <6.0  BUN 6 - 24 mg/dL 8  7   Creatinine, Ser 0.57 - 1.00 mg/dL 0.89  0.76   eGFR >59 mL/min/1.73 75    BUN/Creatinine Ratio 9 - 23 9  9    Sodium 134 - 144 mmol/L 143  143   Potassium 3.5 - 5.2 mmol/L 4.7  4.2   Chloride 96 - 106 mmol/L 104  107 High    Calcium 8.7 - 10.2 mg/dL 9.1  8.9   Phosphorus 3.0 - 4.3 mg/dL 3.5    Total Protein 6.0 - 8.5 g/dL 6.8  6.5   Albumin 3.8 - 4.9 g/dL 3.7 Low   3.9   Globulin, Total 1.5 - 4.5 g/dL 3.1  2.6   Albumin/Globulin Ratio 1.2 - 2.2 1.2  1.5   Bilirubin Total 0.0 - 1.2 mg/dL 0.3  0.3   Alkaline Phosphatase 44 - 121 IU/L 127 High   108 R   LDH 119 - 226 IU/L 185    AST 0 - 40 IU/L 15  15   ALT 0 - 32 IU/L 14  18   GGT 0 - 60 IU/L 21    Iron 27 - 159 ug/dL 65    Cholesterol, Total 100 - 199 mg/dL 169    Triglycerides 0 - 149 mg/dL 106    HDL >39 mg/dL 38 Low     VLDL Cholesterol Cal 5 - 40 mg/dL 19    LDL Chol Calc (NIH) 0 - 99  mg/dL 112 High     Chol/HDL Ratio 0.0 - 4.4 ratio 4.4    Comment:                                   T. Chol/HDL Ratio  Men  Women                                1/2 Avg.Risk  3.4    3.3                                    Avg.Risk  5.0    4.4                                 2X Avg.Risk  9.6    7.1                                 3X Avg.Risk 23.4   11.0   Estimated CHD Risk 0.0 - 1.0 times avg. 1.1 High     Comment: The CHD Risk is based on the T. Chol/HDL ratio. Other  factors affect CHD Risk such as hypertension, smoking,  diabetes, severe obesity, and family history of  premature CHD.   TSH 0.450 - 4.500 uIU/mL 0.086 Low     T4, Total 4.5 - 12.0 ug/dL 8.8    T3 Uptake Ratio 24 - 39 % 28    Free Thyroxine Index 1.2 - 4.9 2.5    WBC 3.4 - 10.8 x10E3/uL 6.1    RBC 3.77 - 5.28 x10E6/uL 5.23    Hemoglobin 11.1 - 15.9 g/dL 13.7    Hematocrit 34.0 - 46.6 % 43.0    MCV 79 - 97 fL 82    MCH 26.6 - 33.0 pg 26.2 Low     MCHC 31.5 - 35.7 g/dL 31.9    RDW 11.7 - 15.4 % 13.1    Platelets 150 - 450 x10E3/uL 227    Neutrophils Not Estab. % 54    Lymphs Not Estab. % 32    Monocytes Not Estab. % 6    Eos Not Estab. % 7    Basos Not Estab. % 1    Neutrophils Absolute 1.4 - 7.0 x10E3/uL 3.4    Lymphocytes Absolute 0.7 - 3.1 x10E3/uL 1.9    Monocytes Absolute 0.1 - 0.9 x10E3/uL 0.4    EOS (ABSOLUTE) 0.0 - 0.4 x10E3/uL 0.4    Basophils Absolute 0.0 - 0.2 x10E3/uL 0.0    Immature Granulocytes Not Estab. % 0    Immature Grans (Abs) 0.0 - 0.1 x10E3/uL 0.0    Resulting Agency  [facility]       Narrative Performed by: Maryan Puls Performed at:  [facility]  [address], [place], [place]  [id number]  Lab Director: Rush Farmer MD, Phone:  [phone]    Specimen Collected: 08/14/21 08:43 Last Resulted: 08/15/21 08:14      Lab Flowsheet    Order Details    View Encounter    Lab and Collection Details    Routing    Result History     View Encounter Conversation      R=Reference range differs from displayed range      Result Care Coordination   Patient Communication   Add Comments   Add Notifications  Back to Top          Contains abnormal data Vitamin B12 Order: [id number] Status: Final result    Visible to patient: Yes (not seen)  Next appt: None    Dx: Routine adult health maintenance    0 Result Notes     Component Ref Range & Units 7 d ago  Vitamin B-12 232 - 1245 pg/mL >2000 High    Resulting Agency  [facility]       Narrative Performed by: Maryan Puls Performed at:  [facility]  [address] [place], [place]  [id number]  Lab Director: Rush Farmer MD, Phone:  [phone]    Specimen Collected: 08/14/21 08:43 Last Resulted: 08/15/21 08:14      Lab Flowsheet    Order Details    View Encounter    Lab and Collection Details    Routing    Result History    View Encounter Conversation        Result Care Coordination   Patient Communication   Add Comments   Add Notifications  Back to Top         VITAMIN D 25 Hydroxy (Vit-D Deficiency, Fractures) Order: [id number] Status: Final result    Visible to patient: Yes (not seen)    Next appt: None    Dx: Routine adult health maintenance    0 Result Notes     Component Ref Range & Units 7 d ago  Vit D, 25-Hydroxy 30.0 - 100.0 ng/mL 68.3   Comment: Vitamin D deficiency has been defined by the Institute of  Medicine and an Endocrine Society practice guideline as a  level of serum 25-OH vitamin D less than 20 ng/mL (1,2).  The Endocrine Society went on to further define vitamin D  insufficiency as a level between 21 and 29 ng/mL (2).  1. IOM (Institute of Medicine). 2010. Dietary reference     intakes for calcium and D. [place]: The     [employer].  2. Holick MF, Binkley Delta, Bischoff-Ferrari HA, et al.     Evaluation, treatment, and prevention of vitamin D     deficiency: an Endocrine Society clinical  practice       _________________________________________  EKG  Sinus  Rhythm at 77 bpm -  Negative precordial T-waves.   WITHIN NORMAL LIMITS ____________________________________________    ____________________________________________   INITIAL IMPRESSION / ASSESSMENT AND PLAN  As part of my medical decision making, I reviewed the following data within the [facility]       Discussed lab and EKG results with patient.  Will consult to endocrinology due to hypothyroidism.      ____________________________________________   FINAL CLINICAL IMPRESSION(S)  Well exam ED Discharge Orders     None        Note:  This document was prepared using Dragon voice recognition software and may include unintentional dictation errors.

## 2021-08-22 NOTE — Addendum Note (Signed)
Addended by: Aliene Altes on: 08/22/2021 04:35 PM   Modules accepted: Orders

## 2021-09-04 DIAGNOSIS — M722 Plantar fascial fibromatosis: Secondary | ICD-10-CM | POA: Diagnosis not present

## 2021-09-12 DIAGNOSIS — M722 Plantar fascial fibromatosis: Secondary | ICD-10-CM | POA: Diagnosis not present

## 2021-09-18 DIAGNOSIS — M722 Plantar fascial fibromatosis: Secondary | ICD-10-CM | POA: Diagnosis not present

## 2021-10-03 ENCOUNTER — Telehealth: Payer: Self-pay

## 2021-10-03 DIAGNOSIS — E063 Autoimmune thyroiditis: Secondary | ICD-10-CM

## 2021-10-03 NOTE — Telephone Encounter (Signed)
Heather Farley sent an email stating that [name] cannot see her until October 2023 & is requesting a referral with another endocrinologist. ? ?Informed her that I can send a referral to [name].  They are also part of [name]. ? ?She agreed to me sending a new referral to [name]. ? ?AMD ?

## 2021-10-08 ENCOUNTER — Other Ambulatory Visit: Payer: Self-pay

## 2021-10-08 ENCOUNTER — Ambulatory Visit (INDEPENDENT_AMBULATORY_CARE_PROVIDER_SITE_OTHER): Payer: 59 | Admitting: Nurse Practitioner

## 2021-10-08 ENCOUNTER — Encounter: Payer: Self-pay | Admitting: Nurse Practitioner

## 2021-10-08 VITALS — BP 123/81 | HR 82 | Ht 67.0 in | Wt 326.4 lb

## 2021-10-08 DIAGNOSIS — E063 Autoimmune thyroiditis: Secondary | ICD-10-CM | POA: Diagnosis not present

## 2021-10-08 DIAGNOSIS — E038 Other specified hypothyroidism: Secondary | ICD-10-CM | POA: Diagnosis not present

## 2021-10-08 MED ORDER — TIROSINT 112 MCG PO CAPS: 2.0000 | ORAL_CAPSULE | Freq: Every day | ORAL | 1 refills | Status: DC
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
## 2021-10-08 NOTE — Patient Instructions (Signed)

## 2021-10-08 NOTE — Progress Notes (Signed)
Pt presents today to complete labs for outside provider Ronny Bacon, NP ?

## 2021-10-09 DIAGNOSIS — M722 Plantar fascial fibromatosis: Secondary | ICD-10-CM | POA: Diagnosis not present

## 2021-10-09 LAB — TSH+FREE T4
Free T4: 1.4 ng/dL (ref 0.82–1.77)
TSH: <0.005 u[IU]/mL — ABNORMAL LOW (ref 0.450–4.500)

## 2021-10-09 LAB — T3, FREE: T3, Free: 5.3 pg/mL — ABNORMAL HIGH (ref 2.0–4.4)

## 2021-10-14 ENCOUNTER — Encounter: Payer: Self-pay | Admitting: Nurse Practitioner

## 2021-10-14 NOTE — Telephone Encounter (Signed)
Have you seen any communication from the pharmacy about this?

## 2021-10-15 ENCOUNTER — Encounter: Payer: Self-pay | Admitting: Nurse Practitioner

## 2021-10-16 ENCOUNTER — Telehealth: Payer: Self-pay

## 2021-10-16 DIAGNOSIS — M722 Plantar fascial fibromatosis: Secondary | ICD-10-CM | POA: Diagnosis not present

## 2021-10-16 NOTE — Telephone Encounter (Signed)
Have you gotten anything about this yet?

## 2021-10-16 NOTE — Telephone Encounter (Signed)
Pt is calling to follow up on this, states [facility] has reached out to [place] needing things answered and will not fill this until. ?

## 2021-10-16 NOTE — Telephone Encounter (Signed)
I called [facility] about her medication and was disconnected. I will try them again. ?

## 2021-10-16 NOTE — Telephone Encounter (Signed)
Patient has left a Vm asking about this? She said she is not able to get the medication and is asking for a call back today ?

## 2021-10-16 NOTE — Telephone Encounter (Signed)
Can you call [facility] and verify they have our right contact info so we can move forward?

## 2021-10-17 NOTE — Telephone Encounter (Signed)
I called the patient to let her know that I called and spoke to Myrtle at [employer] and she stated that she did not see any messages about the medication and it is in process for her Tirosint to be sent to her. Patient verbalized an understanding. ?

## 2021-10-23 DIAGNOSIS — M722 Plantar fascial fibromatosis: Secondary | ICD-10-CM | POA: Diagnosis not present

## 2021-10-25 DIAGNOSIS — M722 Plantar fascial fibromatosis: Secondary | ICD-10-CM | POA: Diagnosis not present

## 2021-10-29 ENCOUNTER — Encounter: Payer: Self-pay | Admitting: Nurse Practitioner

## 2021-10-29 NOTE — Telephone Encounter (Signed)
Patient left a VM in reference to this. She would like a call back in reference to this and the status of it. Thank you ?

## 2021-10-29 NOTE — Telephone Encounter (Signed)
Maudie Mercury, can you check on this?  We have been round and round on this one.

## 2021-11-20 ENCOUNTER — Encounter: Payer: Self-pay | Admitting: Nurse Practitioner

## 2021-11-20 ENCOUNTER — Other Ambulatory Visit: Payer: Self-pay

## 2021-11-20 DIAGNOSIS — E039 Hypothyroidism, unspecified: Secondary | ICD-10-CM

## 2021-11-20 NOTE — Progress Notes (Signed)
Fax lab results to [employer] [place],NP

## 2021-11-20 NOTE — Telephone Encounter (Signed)
Can you take care of this please?? 

## 2021-11-21 LAB — T3, FREE: T3, Free: 3.3 pg/mL (ref 2.0–4.4)

## 2021-11-21 LAB — TSH+FREE T4
Free T4: 2.01 ng/dL — ABNORMAL HIGH (ref 0.82–1.77)
TSH: 0.007 u[IU]/mL — ABNORMAL LOW (ref 0.450–4.500)

## 2021-11-27 ENCOUNTER — Encounter: Payer: Self-pay | Admitting: Nurse Practitioner

## 2021-11-27 ENCOUNTER — Ambulatory Visit (INDEPENDENT_AMBULATORY_CARE_PROVIDER_SITE_OTHER): Payer: 59 | Admitting: Nurse Practitioner

## 2021-11-27 VITALS — BP 131/89 | HR 66 | Ht 67.0 in | Wt 332.0 lb

## 2021-11-27 DIAGNOSIS — E063 Autoimmune thyroiditis: Secondary | ICD-10-CM | POA: Diagnosis not present

## 2021-11-27 DIAGNOSIS — E038 Other specified hypothyroidism: Secondary | ICD-10-CM

## 2021-11-27 MED ORDER — TIROSINT 100 MCG PO CAPS: 100.0000 ug | ORAL_CAPSULE | Freq: Every day | ORAL | 3 refills | Status: DC

## 2021-11-27 MED ORDER — TIROSINT 112 MCG PO CAPS: 1.0000 | ORAL_CAPSULE | Freq: Every day | ORAL | 3 refills | Status: DC

## 2021-11-27 NOTE — Patient Instructions (Signed)
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
## 2022-01-22 ENCOUNTER — Ambulatory Visit: Payer: 59 | Admitting: Nurse Practitioner

## 2022-02-04 ENCOUNTER — Encounter: Payer: Self-pay | Admitting: Nurse Practitioner

## 2022-02-04 ENCOUNTER — Other Ambulatory Visit: Payer: Self-pay | Admitting: Physician Assistant

## 2022-02-04 DIAGNOSIS — Z1231 Encounter for screening mammogram for malignant neoplasm of breast: Secondary | ICD-10-CM

## 2022-02-07 ENCOUNTER — Other Ambulatory Visit: Payer: Self-pay

## 2022-02-07 DIAGNOSIS — E063 Autoimmune thyroiditis: Secondary | ICD-10-CM

## 2022-02-07 NOTE — Progress Notes (Signed)
Presents today for labs for Ronny Bacon, NP

## 2022-02-08 LAB — TSH+FREE T4
Free T4: 2.16 ng/dL — ABNORMAL HIGH (ref 0.82–1.77)
TSH: <0.005 u[IU]/mL — ABNORMAL LOW (ref 0.450–4.500)

## 2022-02-16 ENCOUNTER — Ambulatory Visit
Admission: EM | Admit: 2022-02-16 | Discharge: 2022-02-16 | Disposition: A | Discharge status: Home / Self Care | Payer: 59 | Attending: Emergency Medicine | Admitting: Emergency Medicine

## 2022-02-16 DIAGNOSIS — L509 Urticaria, unspecified: Secondary | ICD-10-CM | POA: Diagnosis not present

## 2022-02-16 MED ORDER — EPINEPHRINE 0.3 MG/0.3ML IJ SOAJ: 0.3000 mg | Freq: Once | INTRAMUSCULAR | 0 refills | Status: AC

## 2022-02-16 MED ORDER — PREDNISONE 10 MG (21) PO TBPK: ORAL_TABLET | ORAL | 0 refills | Status: DC

## 2022-02-16 MED ORDER — PREDNISONE 50 MG PO TABS
60.0000 mg | ORAL_TABLET | Freq: Once | ORAL | Status: AC
  Administered 2022-02-16: 60 mg via ORAL

## 2022-02-16 MED ORDER — HYDROXYZINE HCL 25 MG PO TABS: 25.0000 mg | ORAL_TABLET | Freq: Four times a day (QID) | ORAL | 0 refills | Status: DC | PRN

## 2022-02-16 NOTE — Discharge Instructions (Addendum)
I have given you 60 mg of prednisone here.  You can start the prednisone tomorrow.  Continue the Claritin.  If that stops working, then use the Atarax.  If your tongue swells, you have difficulty breathing, your throat starts swelling shut, wheezing, feeling like you are about to pass out, use the EpiPen and go immediately to the emergency department.  Have the pharmacist show you how to use the EpiPen.

## 2022-02-16 NOTE — ED Provider Notes (Signed)
HPI  SUBJECTIVE:  Heather Farley is a 61 y.o. female who presents with intensely itchy, erythematous, migratory, raised rash starting 2 days ago.  She states that her eyes were swollen this morning and she had more urticaria.  She had a facial 2 days before her symptoms started, but no known exposures known allergens, different foods, change in her medications, recent antibiotics, new lotions, soaps, detergents.  She is very allergic to marijuana smoke, and thinks that she may have been exposed to this, but is not sure.  No angioedema, wheezing, shortness of breath, nausea, vomiting, diarrhea, abdominal pain, syncope, voice changes, sensation of throat swelling shut.  She tried 3 doses of generic Claritin with partial improvement in her symptoms.  No aggravating factors.  She has a past medical history of multiple food allergies, urticaria and Hashimoto's thyroiditis.  No history of diabetes, hypertension.    Past Medical History:  Diagnosis Date   Osteoporosis    Thyroid disease     Past Surgical History:  Procedure Laterality Date   ABDOMINAL HYSTERECTOMY     FOOT SURGERY Left    2018   LASIK     1995   SHOULDER SURGERY Left     Family History  Problem Relation Age of Onset   Healthy Mother    Healthy Father     Social History   Tobacco Use   Smoking status: Never   Smokeless tobacco: Never  Vaping Use   Vaping Use: Never used  Substance Use Topics   Alcohol use: No   Drug use: No     Current Facility-Administered Medications:    predniSONE (DELTASONE) tablet 60 mg, 60 mg, Oral, Once, Domenick Gong, MD  Current Outpatient Medications:    EPINEPHrine 0.3 mg/0.3 mL IJ SOAJ injection, Inject 0.3 mg into the muscle once for 1 dose., Disp: 0.3 mL, Rfl: 0   hydrOXYzine (ATARAX) 25 MG tablet, Take 1 tablet (25 mg total) by mouth every 6 (six) hours as needed for itching., Disp: 20 tablet, Rfl: 0   predniSONE (STERAPRED UNI-PAK 21 TAB) 10 MG (21) TBPK tablet, Dispense one 6  day pack. Take as directed with food., Disp: 21 tablet, Rfl: 0   Cholecalciferol 125 MCG (5000 UT) TABS, Take by mouth., Disp: , Rfl:    Cyanocobalamin (VITAMIN B-12) 1000 MCG SUBL, Place under the tongue., Disp: , Rfl:    Menaquinone-7 (VITAMIN K2 PO), Take by mouth., Disp: , Rfl:    Probiotic Product (UP4 PROBIOTICS PO), Take by mouth., Disp: , Rfl:    TIROSINT 100 MCG CAPS, Take 1 capsule (100 mcg total) by mouth daily before breakfast., Disp: 90 capsule, Rfl: 3   TIROSINT 112 MCG CAPS, Take 1 capsule (112 mcg total) by mouth daily before breakfast., Disp: 90 capsule, Rfl: 3   vitamin C (ASCORBIC ACID) 500 MG tablet, Take 500 mg by mouth daily., Disp: , Rfl:   Allergies  Allergen Reactions   Chicken Allergy Anaphylaxis   Codeine Nausea And Vomiting and Hives   Cayenne Other (See Comments) and Swelling   Soy Allergy     Other reaction(s): Other (See Comments) GI Issues    Hydrocodone-Acetaminophen Itching    Face was flushed   Hydrocodone-Acetaminophen Itching    Face was flushed     ROS  As noted in HPI.   Physical Exam  BP (!) 140/85 (BP Location: Left Arm)   Pulse 65   Temp (!) 97.5 F (36.4 C) (Oral)   Resp 18  SpO2 98%   Constitutional: Well developed, well nourished, no acute distress Eyes:  EOMI, conjunctiva normal bilaterally HENT: Normocephalic, atraumatic,mucus membranes moist.  Mild swelling left lateral lower lip.  Tongue, uvula normal.  Normal voice.   Respiratory: Normal inspiratory effort, lungs clear bilaterally Cardiovascular: Normal rate GI: nondistended skin: Diffuse tender blanchable urticaria over scalp, face, bilateral lower extremities, abdomen            Musculoskeletal: no deformities Neurologic: Alert & oriented x 3, no focal neuro deficits Psychiatric: Speech and behavior appropriate   ED Course   Medications  predniSONE (DELTASONE) tablet 60 mg (has no administration in time range)    No orders of the defined types  were placed in this encounter.   No results found for this or any previous visit (from the past 24 hour(s)). No results found.  ED Clinical Impression  1. Urticaria      ED Assessment/Plan  Patient to continue Claritin.  Giving 60 mg of prednisone here  as patient appears to have significant amount of itching.  Will send home with a 6-day 10 mg prednisone taper and Atarax in case the Claritin stops working.  EpiPen f in case this progresses to anaphylaxis.  Discussed MDM, treatment plan, and plan for follow-up with patient. Discussed sn/sx that should prompt return to the ED. patient agrees with plan.   Meds ordered this encounter  Medications   predniSONE (DELTASONE) tablet 60 mg   predniSONE (STERAPRED UNI-PAK 21 TAB) 10 MG (21) TBPK tablet    Sig: Dispense one 6 day pack. Take as directed with food.    Dispense:  21 tablet    Refill:  0   EPINEPHrine 0.3 mg/0.3 mL IJ SOAJ injection    Sig: Inject 0.3 mg into the muscle once for 1 dose.    Dispense:  0.3 mL    Refill:  0   hydrOXYzine (ATARAX) 25 MG tablet    Sig: Take 1 tablet (25 mg total) by mouth every 6 (six) hours as needed for itching.    Dispense:  20 tablet    Refill:  0      *This clinic note was created using Scientist, clinical (histocompatibility and immunogenetics). Therefore, there may be occasional mistakes despite careful proofreading.  ?    Domenick Gong, MD 02/18/22 [phone]

## 2022-02-16 NOTE — ED Triage Notes (Signed)
Pt presents with hives & itching X 2 days that is unrelieved with OTC antihistamine.

## 2022-02-18 NOTE — Patient Instructions (Signed)

## 2022-02-19 ENCOUNTER — Other Ambulatory Visit: Payer: Self-pay | Admitting: Nurse Practitioner

## 2022-02-19 ENCOUNTER — Ambulatory Visit (INDEPENDENT_AMBULATORY_CARE_PROVIDER_SITE_OTHER): Payer: 59 | Admitting: Nurse Practitioner

## 2022-02-19 ENCOUNTER — Encounter: Payer: Self-pay | Admitting: Nurse Practitioner

## 2022-02-19 VITALS — BP 136/84 | HR 73 | Ht 67.0 in | Wt 331.0 lb

## 2022-02-19 DIAGNOSIS — E038 Other specified hypothyroidism: Secondary | ICD-10-CM

## 2022-02-19 DIAGNOSIS — E063 Autoimmune thyroiditis: Secondary | ICD-10-CM

## 2022-02-19 MED ORDER — WEGOVY 0.5 MG/0.5ML ~~LOC~~ SOAJ
0.5000 mg | SUBCUTANEOUS | 0 refills | Status: DC
Start: 2022-02-19 — End: 2022-03-04

## 2022-02-19 MED ORDER — TIROSINT 200 MCG PO CAPS: 200.0000 ug | ORAL_CAPSULE | Freq: Every day | ORAL | 1 refills | Status: DC

## 2022-02-19 MED ORDER — WEGOVY 1 MG/0.5ML ~~LOC~~ SOAJ
1.0000 mg | SUBCUTANEOUS | 0 refills | Status: DC
Start: 2022-02-19 — End: 2022-08-20
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
## 2022-02-20 NOTE — Telephone Encounter (Signed)
Let's try the PA for her

## 2022-02-26 ENCOUNTER — Ambulatory Visit
Admission: RE | Admit: 2022-02-26 | Discharge: 2022-02-26 | Disposition: A | Discharge status: Home / Self Care | Payer: 59 | Source: Ambulatory Visit | Attending: Physician Assistant | Admitting: Physician Assistant

## 2022-02-26 DIAGNOSIS — Z1231 Encounter for screening mammogram for malignant neoplasm of breast: Secondary | ICD-10-CM | POA: Diagnosis not present

## 2022-03-26 ENCOUNTER — Telehealth: Payer: Self-pay

## 2022-03-26 ENCOUNTER — Other Ambulatory Visit (HOSPITAL_COMMUNITY): Payer: Self-pay

## 2022-03-26 NOTE — Telephone Encounter (Signed)
Can you check on PA for [facility] please?

## 2022-03-26 NOTE — Telephone Encounter (Signed)
PA has been DENIED by the patients current insurance plan. It was denied due to not being the prescription benefits formulary.   Denial has been attached to patients file.

## 2022-03-26 NOTE — Telephone Encounter (Signed)
Will you call the patient and let her know that the [facility] was denied?

## 2022-03-26 NOTE — Telephone Encounter (Signed)
PA for [facility] 1mg /0.97ml auto-injector has been started in CoverMyMeds.   PA has been submitted.   Awaiting additional questions.   Key: [place]ona Coach

## 2022-03-26 NOTE — Telephone Encounter (Signed)
Patient was called and given the new that this prescription had not been approved. I ask that she call her insurance company and find out what medication in this group , that they will cover. Once she finds out , to please let us know.

## 2022-04-18 ENCOUNTER — Ambulatory Visit: Payer: 59

## 2022-04-18 DIAGNOSIS — Z23 Encounter for immunization: Secondary | ICD-10-CM

## 2022-05-07 ENCOUNTER — Encounter: Payer: Self-pay | Admitting: Nurse Practitioner

## 2022-05-08 NOTE — Telephone Encounter (Signed)
Can you send copy of the labs ordered to the number listed so her employer will draw them?

## 2022-05-15 ENCOUNTER — Other Ambulatory Visit: Payer: Self-pay

## 2022-05-15 DIAGNOSIS — E063 Autoimmune thyroiditis: Secondary | ICD-10-CM

## 2022-05-15 NOTE — Progress Notes (Signed)
Pt presents today to complete tsh, free t4 labs.

## 2022-05-16 LAB — TSH+FREE T4
Free T4: 1.63 ng/dL (ref 0.82–1.77)
TSH: 0.033 u[IU]/mL — ABNORMAL LOW (ref 0.450–4.500)

## 2022-05-26 NOTE — Patient Instructions (Signed)
.  1l 

## 2022-05-27 ENCOUNTER — Ambulatory Visit (INDEPENDENT_AMBULATORY_CARE_PROVIDER_SITE_OTHER): Payer: 59 | Admitting: Nurse Practitioner

## 2022-05-27 ENCOUNTER — Encounter: Payer: Self-pay | Admitting: Nurse Practitioner

## 2022-05-27 DIAGNOSIS — E063 Autoimmune thyroiditis: Secondary | ICD-10-CM

## 2022-05-27 DIAGNOSIS — R5382 Chronic fatigue, unspecified: Secondary | ICD-10-CM

## 2022-05-27 DIAGNOSIS — E038 Other specified hypothyroidism: Secondary | ICD-10-CM

## 2022-05-27 DIAGNOSIS — Z6841 Body Mass Index (BMI) 40.0 and over, adult: Secondary | ICD-10-CM | POA: Diagnosis not present
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
## 2022-07-03 ENCOUNTER — Ambulatory Visit: Payer: Self-pay | Admitting: Physician Assistant

## 2022-07-03 ENCOUNTER — Encounter: Payer: Self-pay | Admitting: Physician Assistant

## 2022-07-03 VITALS — BP 146/96 | HR 90 | Temp 97.6°F | Resp 12 | Ht 67.0 in | Wt 320.0 lb

## 2022-07-03 DIAGNOSIS — J9801 Acute bronchospasm: Secondary | ICD-10-CM

## 2022-07-03 MED ORDER — METHYLPREDNISOLONE 4 MG PO TBPK: ORAL_TABLET | ORAL | 0 refills | Status: DC

## 2022-07-03 MED ORDER — ALBUTEROL SULFATE HFA 108 (90 BASE) MCG/ACT IN AERS: 2.0000 | INHALATION_SPRAY | Freq: Four times a day (QID) | RESPIRATORY_TRACT | 0 refills | Status: DC | PRN

## 2022-07-03 MED ORDER — PROMETHAZINE-DM 6.25-15 MG/5ML PO SYRP: 5.0000 mL | ORAL_SOLUTION | Freq: Four times a day (QID) | ORAL | 0 refills | Status: DC | PRN

## 2022-07-03 NOTE — Progress Notes (Signed)
Pt presents today for 6 weeks of coughing, and feels like its settling in chest. Pt gets short of breath out side?

## 2022-07-03 NOTE — Progress Notes (Signed)
   Subjective: Chronic cough    Patient ID: Heather Farley, female    DOB: [birth year], 62 y.o.   MRN: [id number]  HPI  Patient complains of nonproductive cough for 6 weeks.  Denies fever chills associated with complaint.  Patient states mild dyspnea secondary to coughing spells.  Patient also stated mild wheezing.  Review of Systems Hypothyroidism    Objective:   Physical Exam  BP is 146/96, pulse 90, respiration 12, temperature 97.6, patient 1% O2 sat on room air.  Patient weighs 220 pounds and BMI 50.12. HEENT is unremarkable. Neck is supple before lymphadenopathy or bruits. Lungs with mild wheezing and increased cough with deep inspirations. Heart regular rate and rhythm.      Assessment & Plan: Cough due to bronchospasm   Patient given a prescription for Phenergan DM, Medrol Dosepak, and Ventolin inhaler.  Patient advised to follow-up 1 week if no improvement or worsening complaints.

## 2022-07-21 ENCOUNTER — Other Ambulatory Visit: Payer: Self-pay | Admitting: Physician Assistant

## 2022-07-21 MED ORDER — BENZONATATE 200 MG PO CAPS: 200.0000 mg | ORAL_CAPSULE | Freq: Three times a day (TID) | ORAL | 0 refills | Status: DC | PRN

## 2022-08-12 ENCOUNTER — Ambulatory Visit: Payer: Self-pay

## 2022-08-12 DIAGNOSIS — Z0000 Encounter for general adult medical examination without abnormal findings: Secondary | ICD-10-CM

## 2022-08-12 LAB — POCT URINALYSIS DIPSTICK
Bilirubin, UA: NEGATIVE
Blood, UA: NEGATIVE
Glucose, UA: NEGATIVE
Ketones, UA: NEGATIVE
Leukocytes, UA: NEGATIVE
Nitrite, UA: NEGATIVE
Protein, UA: NEGATIVE
Spec Grav, UA: 1.02 (ref 1.010–1.025)
Urobilinogen, UA: 0.2 E.U./dL
pH, UA: 6 (ref 5.0–8.0)

## 2022-08-13 LAB — CMP12+LP+TP+TSH+6AC+CBC/D/PLT
ALT: 13 IU/L (ref 0–32)
AST: 11 IU/L (ref 0–40)
Albumin/Globulin Ratio: 1.3 (ref 1.2–2.2)
Albumin: 3.7 g/dL — ABNORMAL LOW (ref 3.8–4.9)
Alkaline Phosphatase: 131 IU/L — ABNORMAL HIGH (ref 44–121)
BUN/Creatinine Ratio: 10 — ABNORMAL LOW (ref 12–28)
BUN: 7 mg/dL — ABNORMAL LOW (ref 8–27)
Basophils Absolute: 0 10*3/uL (ref 0.0–0.2)
Basos: 1 %
Bilirubin Total: 0.4 mg/dL (ref 0.0–1.2)
Calcium: 9.1 mg/dL (ref 8.7–10.3)
Chloride: 104 mmol/L (ref 96–106)
Chol/HDL Ratio: 4.8 ratio — ABNORMAL HIGH (ref 0.0–4.4)
Cholesterol, Total: 186 mg/dL (ref 100–199)
Creatinine, Ser: 0.68 mg/dL (ref 0.57–1.00)
EOS (ABSOLUTE): 0.4 10*3/uL (ref 0.0–0.4)
Eos: 6 %
Estimated CHD Risk: 1.2 times avg. — ABNORMAL HIGH (ref 0.0–1.0)
Free Thyroxine Index: 5.6 — ABNORMAL HIGH (ref 1.2–4.9)
GGT: 23 IU/L (ref 0–60)
Globulin, Total: 2.8 g/dL (ref 1.5–4.5)
Glucose: 91 mg/dL (ref 70–99)
HDL: 39 mg/dL — ABNORMAL LOW (ref >39)
Hematocrit: 41.8 % (ref 34.0–46.6)
Hemoglobin: 13.8 g/dL (ref 11.1–15.9)
Immature Grans (Abs): 0 10*3/uL (ref 0.0–0.1)
Immature Granulocytes: 0 %
Iron: 82 ug/dL (ref 27–159)
LDH: 198 IU/L (ref 119–226)
LDL Chol Calc (NIH): 129 mg/dL — ABNORMAL HIGH (ref 0–99)
Lymphocytes Absolute: 1.9 10*3/uL (ref 0.7–3.1)
Lymphs: 30 %
MCH: 27.2 pg (ref 26.6–33.0)
MCHC: 33 g/dL (ref 31.5–35.7)
MCV: 82 fL (ref 79–97)
Monocytes Absolute: 0.4 10*3/uL (ref 0.1–0.9)
Monocytes: 6 %
Neutrophils Absolute: 3.7 10*3/uL (ref 1.4–7.0)
Neutrophils: 57 %
Phosphorus: 3.7 mg/dL (ref 3.0–4.3)
Platelets: 250 10*3/uL (ref 150–450)
Potassium: 4.4 mmol/L (ref 3.5–5.2)
RBC: 5.08 x10E6/uL (ref 3.77–5.28)
RDW: 13.2 % (ref 11.7–15.4)
Sodium: 141 mmol/L (ref 134–144)
T3 Uptake Ratio: 37 % (ref 24–39)
T4, Total: 15.1 ug/dL — ABNORMAL HIGH (ref 4.5–12.0)
TSH: 0.006 u[IU]/mL — ABNORMAL LOW (ref 0.450–4.500)
Total Protein: 6.5 g/dL (ref 6.0–8.5)
Triglycerides: 99 mg/dL (ref 0–149)
Uric Acid: 5 mg/dL (ref 3.0–7.2)
VLDL Cholesterol Cal: 18 mg/dL (ref 5–40)
WBC: 6.4 10*3/uL (ref 3.4–10.8)
eGFR: 100 mL/min/{1.73_m2} (ref >59)

## 2022-08-13 LAB — HGB A1C W/O EAG: Hgb A1c MFr Bld: 5.6 % (ref 4.8–5.6)

## 2022-08-13 LAB — VITAMIN D 25 HYDROXY (VIT D DEFICIENCY, FRACTURES): Vit D, 25-Hydroxy: 62.9 ng/mL (ref 30.0–100.0)

## 2022-08-13 LAB — T4, FREE: Free T4: 2.34 ng/dL — ABNORMAL HIGH (ref 0.82–1.77)

## 2022-08-19 ENCOUNTER — Encounter: Payer: 59 | Admitting: Physician Assistant

## 2022-08-19 DIAGNOSIS — Z0000 Encounter for general adult medical examination without abnormal findings: Secondary | ICD-10-CM

## 2022-08-20 ENCOUNTER — Ambulatory Visit: Payer: Self-pay | Admitting: Physician Assistant

## 2022-08-20 ENCOUNTER — Encounter: Payer: Self-pay | Admitting: Physician Assistant

## 2022-08-20 DIAGNOSIS — Z1152 Encounter for screening for COVID-19: Secondary | ICD-10-CM

## 2022-08-20 LAB — POC COVID19 BINAXNOW: SARS Coronavirus 2 Ag: POSITIVE — AB

## 2022-08-20 MED ORDER — IBUPROFEN 800 MG PO TABS: 800.0000 mg | ORAL_TABLET | Freq: Three times a day (TID) | ORAL | 0 refills | Status: DC | PRN

## 2022-08-20 MED ORDER — NIRMATRELVIR/RITONAVIR (PAXLOVID)TABLET: 3.0000 | ORAL_TABLET | Freq: Two times a day (BID) | ORAL | 0 refills | Status: AC

## 2022-08-20 MED ORDER — PROMETHAZINE-DM 6.25-15 MG/5ML PO SYRP: 5.0000 mL | ORAL_SOLUTION | Freq: Four times a day (QID) | ORAL | 0 refills | Status: DC | PRN

## 2022-08-20 NOTE — Progress Notes (Signed)
-  Pt presents with cough, fever, head ache, drainage, and congestion since 08-18-22 Pt tested positive for covid today.

## 2022-08-20 NOTE — Progress Notes (Signed)
   Subjective: Cough and sinus congestion    Patient ID: Heather Farley, female    DOB: 1960/10/07, 62 y.o.   MRN: [id number]  HPI Patient complains of cough and sinus congestion for 3 days.  States 2 days ago she had fever and bodyaches.  States fever has resolved.  Patient test positive for COVID-19 this morning.   Review of Systems Hypothyroidism    Objective:   Physical Exam  Deferred secondary to telephonic encounter      Assessment & Plan: COVID-19   Patient given a prescription for Paxlovid, Phenergan DM, and ibuprofen.  Advised to quarantine requirements.  Advised to return to work requirements.  Advised to follow-up if no improvement or worsening complaints.

## 2022-08-27 ENCOUNTER — Telehealth: Payer: 59 | Admitting: Nurse Practitioner

## 2022-08-27 ENCOUNTER — Ambulatory Visit (INDEPENDENT_AMBULATORY_CARE_PROVIDER_SITE_OTHER): Payer: 59 | Admitting: Nurse Practitioner

## 2022-08-27 ENCOUNTER — Ambulatory Visit: Payer: 59 | Admitting: Nurse Practitioner

## 2022-08-27 ENCOUNTER — Encounter: Payer: Self-pay | Admitting: Nurse Practitioner

## 2022-08-27 VITALS — BP 130/75 | HR 79 | Ht 67.0 in | Wt 326.2 lb

## 2022-08-27 DIAGNOSIS — E063 Autoimmune thyroiditis: Secondary | ICD-10-CM | POA: Diagnosis not present

## 2022-08-27 DIAGNOSIS — E038 Other specified hypothyroidism: Secondary | ICD-10-CM

## 2022-08-27 DIAGNOSIS — Z6841 Body Mass Index (BMI) 40.0 and over, adult: Secondary | ICD-10-CM | POA: Diagnosis not present

## 2022-08-27 DIAGNOSIS — R5382 Chronic fatigue, unspecified: Secondary | ICD-10-CM

## 2022-08-27 MED ORDER — THYROID 120 MG PO TABS: 120.0000 mg | ORAL_TABLET | Freq: Every day | ORAL | 1 refills | Status: DC
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
## 2022-08-27 NOTE — Patient Instructions (Signed)

## 2022-09-04 ENCOUNTER — Encounter: Payer: Self-pay | Admitting: Physician Assistant

## 2022-09-04 ENCOUNTER — Ambulatory Visit: Payer: Self-pay | Admitting: Physician Assistant

## 2022-09-04 DIAGNOSIS — R0981 Nasal congestion: Secondary | ICD-10-CM

## 2022-09-04 DIAGNOSIS — R052 Subacute cough: Secondary | ICD-10-CM

## 2022-09-04 DIAGNOSIS — H6992 Unspecified Eustachian tube disorder, left ear: Secondary | ICD-10-CM

## 2022-09-04 MED ORDER — BENZONATATE 200 MG PO CAPS: 200.0000 mg | ORAL_CAPSULE | Freq: Two times a day (BID) | ORAL | 0 refills | Status: DC | PRN

## 2022-09-04 MED ORDER — PSEUDOEPHEDRINE HCL ER 120 MG PO TB12: 120.0000 mg | ORAL_TABLET | Freq: Two times a day (BID) | ORAL | 0 refills | Status: DC

## 2022-09-04 NOTE — Progress Notes (Signed)
   Subjective: Nasal congestion and left ear discomfort    Patient ID: Heather Farley, female    DOB: Jun 23, 1961, 62 y.o.   MRN: [id number]  HPI Patient states status post diagnosis treated for COVID-19 she has had continued nasal congestion and left ear discomfort.  Patient denies hearing loss.  Describes discomfort as "fullness".  Right side not affected.   Review of Systems Vitamin B12 and vitamin D deficiency.  Hypothyroidism.    Objective:   Physical Exam See nurses note for vital signs. HEENT is remarkable for left maxillary guarding.  Edematous left TM.  No erythema.  No drainage. Lungs are clear to auscultation. Heart regular rate and rhythm.       Assessment & Plan: Nasal congestion and eustachian tube dysfunction.  Patient given a prescription for Sudafed and Tessalon Perles.  Advised to follow-up with no improvement or worsening of complaint.

## 2022-09-04 NOTE — Progress Notes (Signed)
Pt presents today with ear fullness, both ears are clear from wax. Pt ears seem to have fluid and pressure build up in both. Pt is 3 weeks post covid and has had a cough since November of 2023.

## 2022-09-09 ENCOUNTER — Ambulatory Visit: Payer: Self-pay | Admitting: Physician Assistant

## 2022-09-09 ENCOUNTER — Encounter: Payer: Self-pay | Admitting: Physician Assistant

## 2022-09-09 VITALS — BP 145/73 | HR 86 | Temp 97.6°F | Resp 14 | Ht 67.0 in | Wt 326.0 lb

## 2022-09-09 DIAGNOSIS — Z0000 Encounter for general adult medical examination without abnormal findings: Secondary | ICD-10-CM

## 2022-09-09 MED ORDER — FEXOFENADINE-PSEUDOEPHED ER 60-120 MG PO TB12: 1.0000 | ORAL_TABLET | Freq: Two times a day (BID) | ORAL | 0 refills | Status: DC

## 2022-09-09 NOTE — Progress Notes (Signed)
Ears still feeling full, still having cough, and patient requesting both shins looked at due to discoloration. Pt states it can be painful.
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
## 2022-09-16 ENCOUNTER — Telehealth: Payer: Self-pay

## 2022-09-16 DIAGNOSIS — H6992 Unspecified Eustachian tube disorder, left ear: Secondary | ICD-10-CM

## 2022-09-16 NOTE — Telephone Encounter (Signed)
  Hey guys;    My ear is still not right.  Do I need to come back in for another course of action or do I need a referral to an ENT doc?    Thank you!  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   Referral faxed to [facility]  AMD

## 2022-10-08 ENCOUNTER — Ambulatory Visit
Admission: RE | Admit: 2022-10-08 | Discharge: 2022-10-08 | Disposition: A | Discharge status: Home / Self Care | Payer: 59 | Source: Ambulatory Visit | Attending: Student | Admitting: Student

## 2022-10-08 ENCOUNTER — Other Ambulatory Visit: Payer: Self-pay | Admitting: Student

## 2022-10-08 DIAGNOSIS — R053 Chronic cough: Secondary | ICD-10-CM

## 2022-10-08 DIAGNOSIS — H6983 Other specified disorders of Eustachian tube, bilateral: Secondary | ICD-10-CM | POA: Diagnosis not present

## 2022-10-08 DIAGNOSIS — H903 Sensorineural hearing loss, bilateral: Secondary | ICD-10-CM | POA: Diagnosis not present

## 2022-10-16 ENCOUNTER — Other Ambulatory Visit: Payer: Self-pay | Admitting: Nurse Practitioner

## 2022-10-17 LAB — LAB REPORT - SCANNED
A1c: 5.5
EGFR: 82

## 2022-11-05 DIAGNOSIS — H903 Sensorineural hearing loss, bilateral: Secondary | ICD-10-CM | POA: Diagnosis not present

## 2022-11-05 DIAGNOSIS — R053 Chronic cough: Secondary | ICD-10-CM | POA: Diagnosis not present

## 2022-11-05 DIAGNOSIS — H6983 Other specified disorders of Eustachian tube, bilateral: Secondary | ICD-10-CM | POA: Diagnosis not present

## 2022-11-25 ENCOUNTER — Other Ambulatory Visit: Payer: Self-pay | Admitting: Physician Assistant

## 2022-11-25 DIAGNOSIS — Z1231 Encounter for screening mammogram for malignant neoplasm of breast: Secondary | ICD-10-CM

## 2022-11-28 IMAGING — MR MR FOOT*R* WO/W CM
9 series · 40 of 40 positions shown · IV contrast (gadavist)
Comparison: X-ray 02/20/2016

CLINICAL DATA: Right foot pain

EXAM:
MRI OF THE RIGHT FOREFOOT WITHOUT AND WITH CONTRAST
TECHNIQUE: Multiplanar, multisequence MR imaging of the right forefoot was
performed before and after the administration of intravenous
contrast.
CONTRAST:  10mL GADAVIST GADOBUTROL 1 MMOL/ML IV SOLN

[Series 4: T1 · coronal · right · 3.0mm · 0.47mm/px · 7 of 46 slices shown (1 of 2)]
[im 1/46]
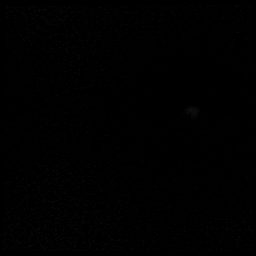
[im 8/46]
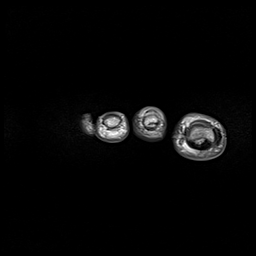
[im 16/46]
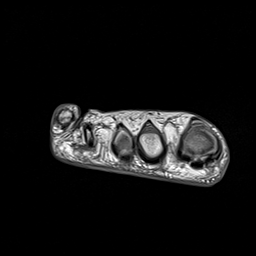
[im 23/46]
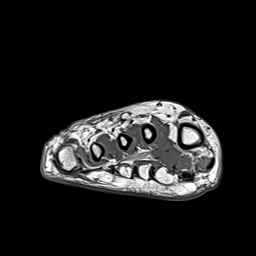
[im 31/46]
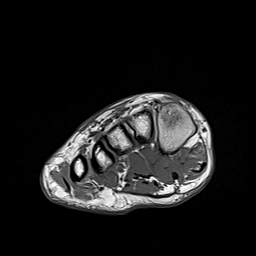
[im 38/46]
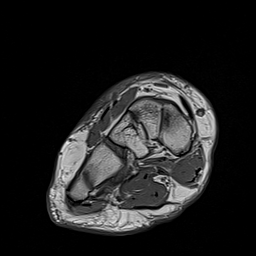
[im 46/46]
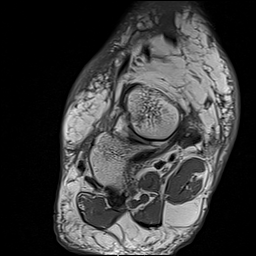

[Series 5: T2 fat-sat · coronal · right · 3.0mm · 0.38mm/px · 6 of 46 slices shown (1 of 2)]
[im 1/46]
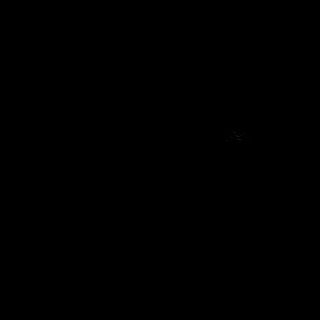
[im 10/46]
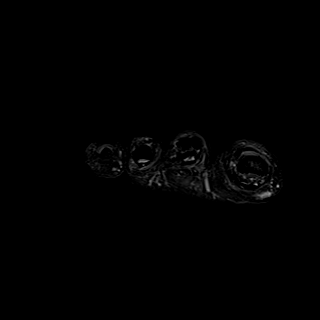
[im 19/46]
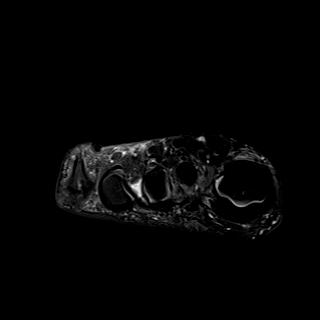
[im 28/46]
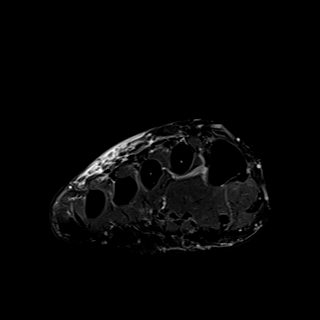
[im 37/46]
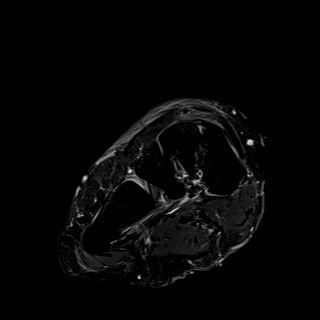
[im 46/46]
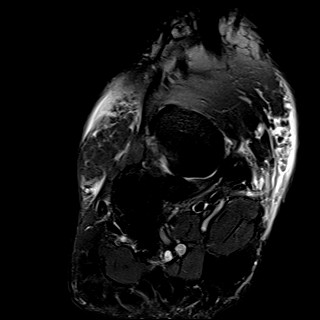

[Series 6: T2 fat-sat · axial · right · 3.0mm · 0.70mm/px · z∈[-76,+12]mm · 3 of 28 slices shown (2 of 2)]
[im 1/28]
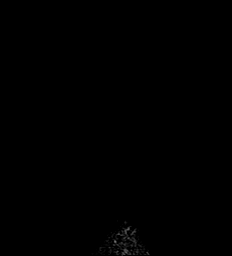
[im 14/28]
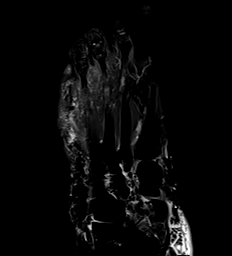
[im 28/28]
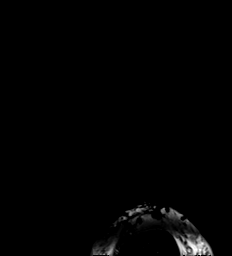

[Series 7: T1 · axial · right · 3.0mm · 0.70mm/px · z∈[-76,+12]mm · 3 of 28 slices shown (2 of 2)]
[im 1/28]
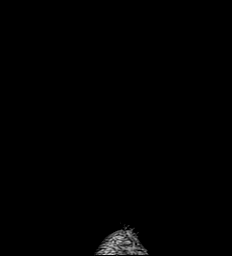
[im 14/28]
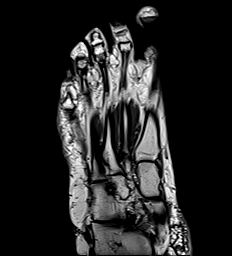
[im 28/28]
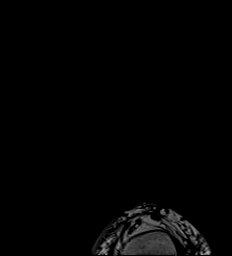

[Series 8: STIR · sagittal · right · 3.0mm · 0.35mm/px · 3 of 27 slices shown]
[im 1/27]
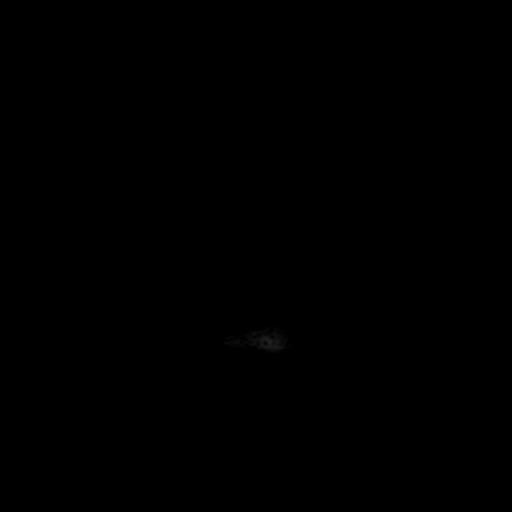
[im 14/27]
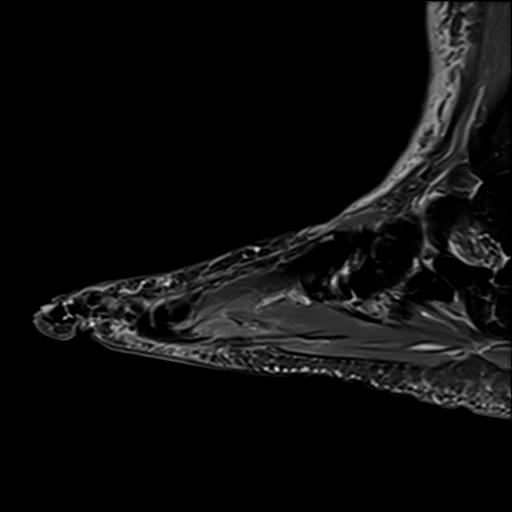
[im 27/27]
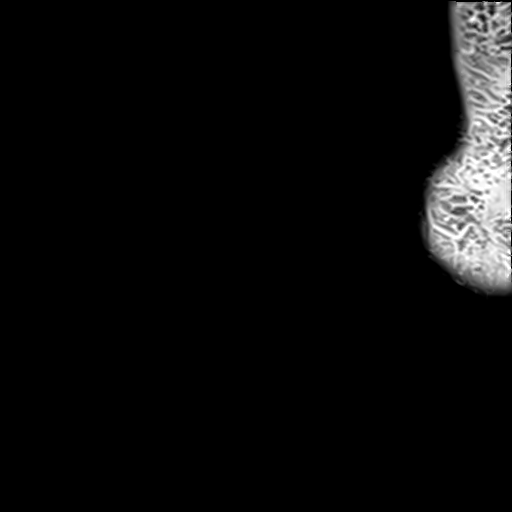

[Series 9: T1 fat-sat · coronal · non-contrast · right · 3.0mm · 0.47mm/px · 6 of 46 slices shown]
[im 1/46]
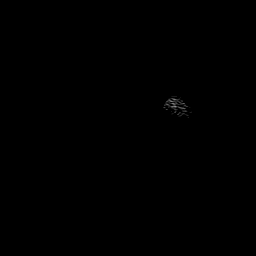
[im 10/46]
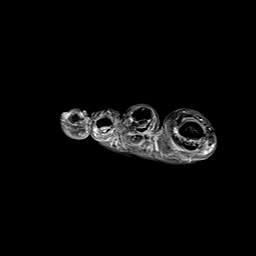
[im 19/46]
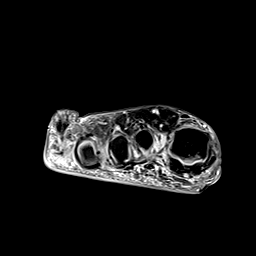
[im 28/46]
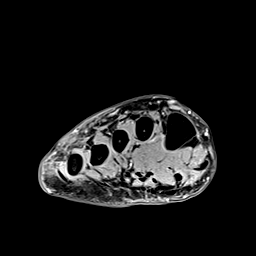
[im 37/46]
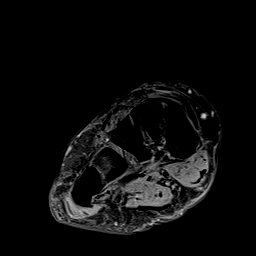
[im 46/46]
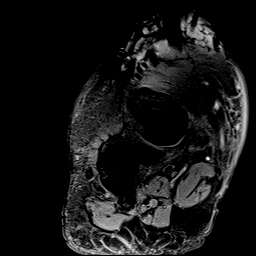

[Series 10: T1 fat-sat post-contrast · sagittal · right · 3.0mm · 0.35mm/px · 3 of 27 slices shown (1 of 3)]
[im 1/27]
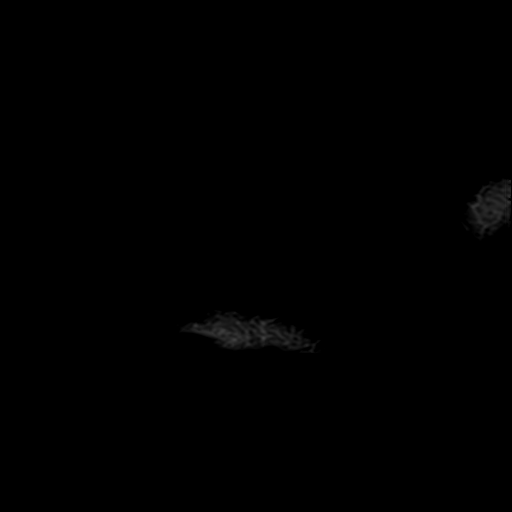
[im 14/27]
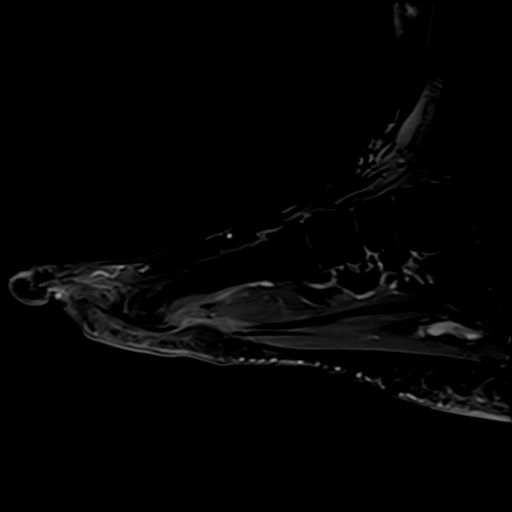
[im 27/27]
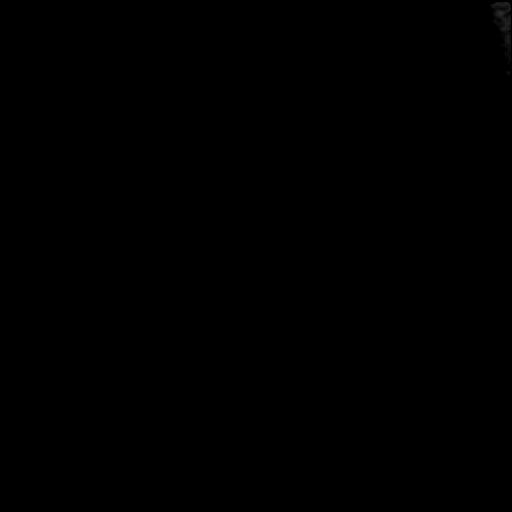

[Series 11: T1 fat-sat post-contrast · coronal · right · 3.0mm · 0.47mm/px · 6 of 46 slices shown (2 of 3)]
[im 1/46]
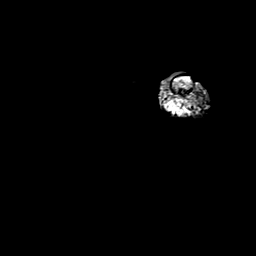
[im 10/46]
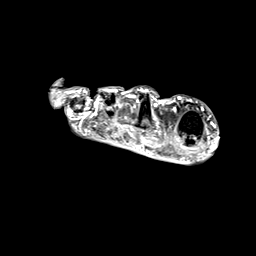
[im 19/46]
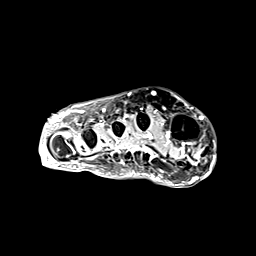
[im 28/46]
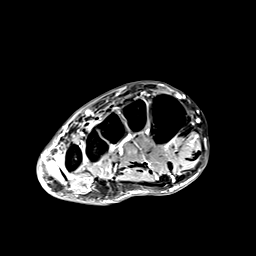
[im 37/46]
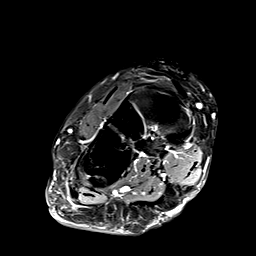
[im 46/46]
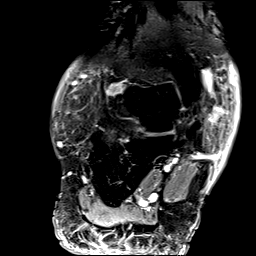

[Series 12: T1 fat-sat post-contrast · axial · right · 3.0mm · 0.56mm/px · z∈[-76,+12]mm · 3 of 28 slices shown (3 of 3)]
[im 1/28]
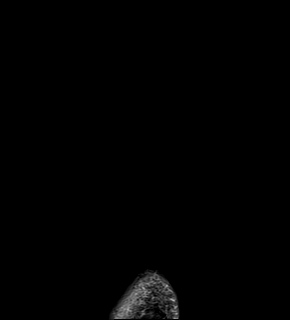
[im 14/28]
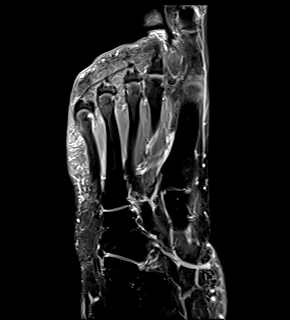
[im 28/28]
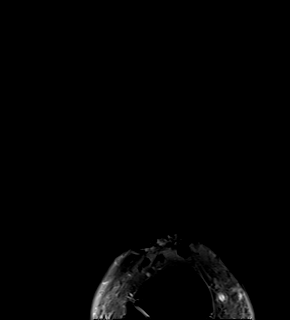

[40 of 40 positions shown; findings below may reference images not displayed]

FINDINGS: Bones/Joint/Cartilage

No acute fracture. No dislocation. No bone marrow edema. No
periostitis. No cartilage defect or evidence of significant
arthropathy of the right forefoot. No joint effusion or synovitis.
No bony erosion.

Ligaments

Intact Lisfranc ligament. Collateral ligaments of the forefoot are
intact. No evidence of plantar plate disruption.

Muscles and Tendons

Intact flexor and extensor tendons without evidence of tendinosis,
tear, or tenosynovitis. Normal muscle bulk and signal intensity
without edema, atrophy, or fatty infiltration.

Soft tissues

Small fluid collection within the third intermetatarsal space. Trace
fluid in the first and second intermetatarsal spaces. No
intermetatarsal space soft tissue mass. Mild subcutaneous edema over
the dorsum of the forefoot. There is subcutaneous edema also seen at
the hindfoot/ankle. No organized fluid collection. No soft tissue
ulceration. No abnormal postcontrast enhancement.
IMPRESSION: 1. No acute osseous abnormality or significant arthropathy of the
right forefoot.
2. Small fluid collection within the third intermetatarsal space,
which may represent a mild bursitis. Trace fluid in the first and
second intermetatarsal spaces. No intermetatarsal space
mass/neuroma.
3. Mild subcutaneous edema over the dorsum of the forefoot,
nonspecific.

## 2022-11-28 IMAGING — MR MR KNEE*R* W/O CM
6 series · 38 of 40 positions shown · non-contrast
Comparison: None.

CLINICAL DATA: Right leg pain

EXAM:
MRI OF THE RIGHT KNEE WITHOUT CONTRAST
TECHNIQUE: Multiplanar, multisequence MR imaging of the knee was performed. No
intravenous contrast was administered.

[Series 10: T2 fat-sat · axial · right · 4.0mm · 0.61mm/px · z∈[-19,+131]mm · 8 of 36 slices shown (1 of 3)]
[im 1/36]
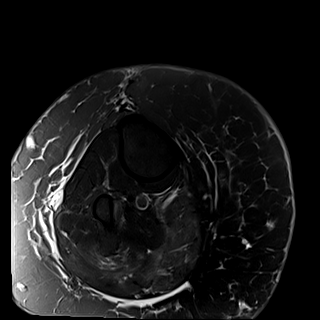
[im 6/36]
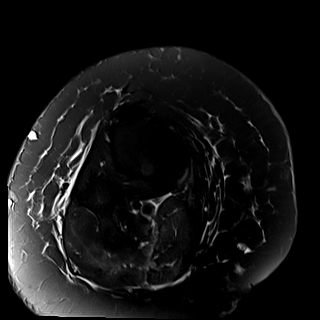
[im 11/36]
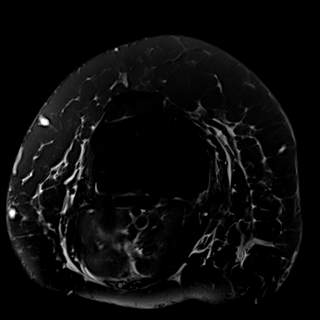
[im 16/36]
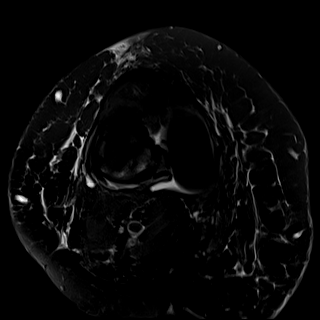
[im 21/36]
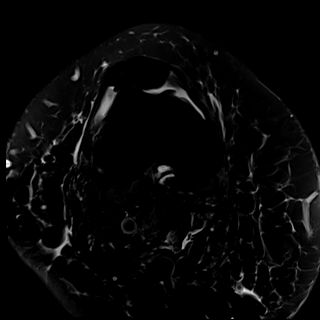
[im 26/36]
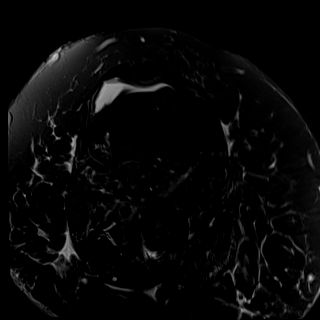
[im 31/36]
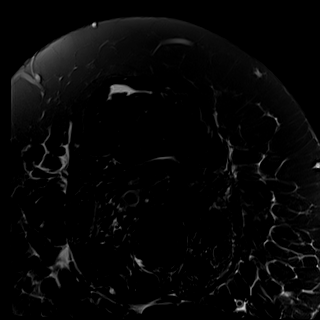
[im 36/36]
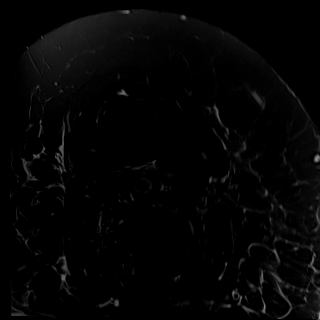

[Series 11: T1 · coronal · right · 4.0mm · 0.35mm/px · 4 of 25 slices shown]
[im 1/25]
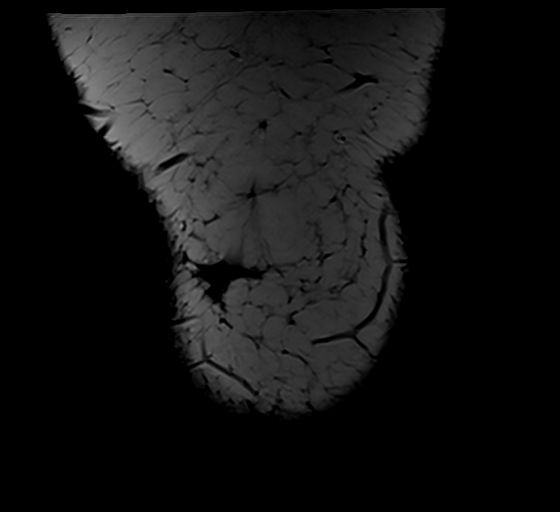
[im 5/25]
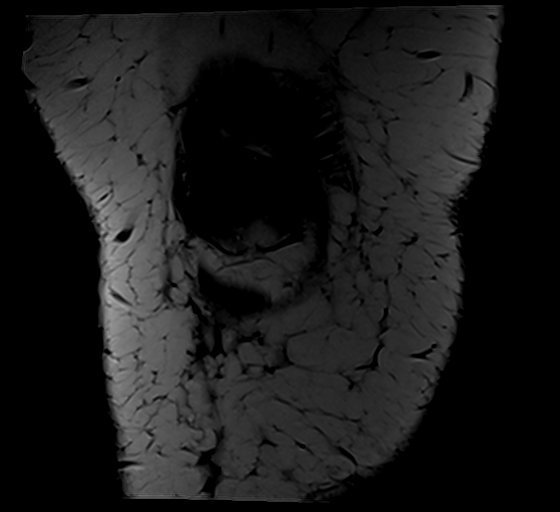
[im 10/25]
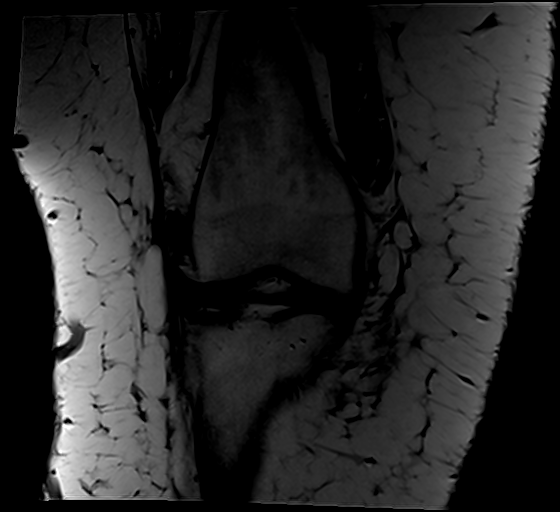
[im 15/25]
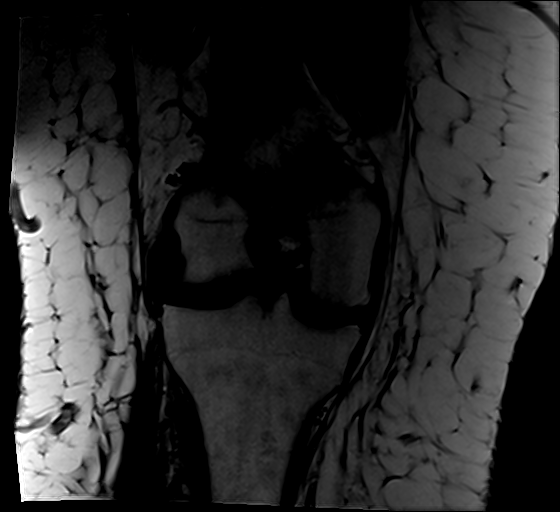

[Series 12: T2 fat-sat · sagittal · right · 4.0mm · 0.59mm/px · 6 of 25 slices shown (2 of 3)]
[im 1/25]
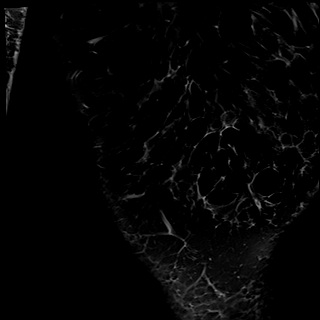
[im 5/25]
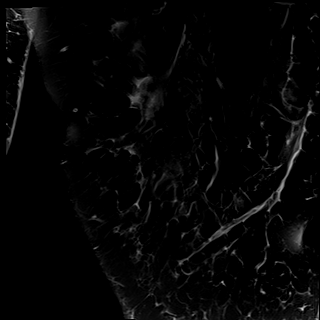
[im 10/25]
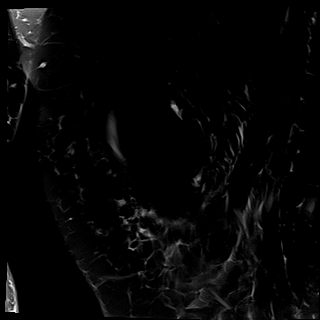
[im 15/25]
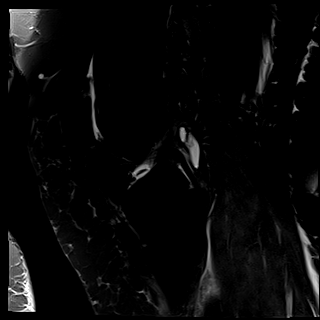
[im 20/25]
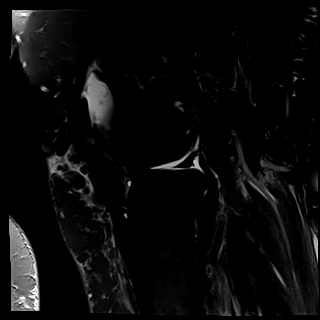
[im 25/25]
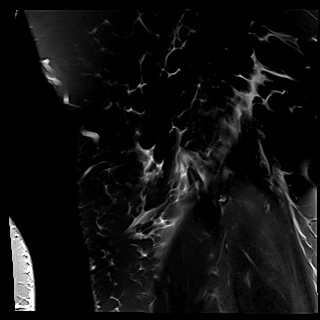

[Series 13: T2 fat-sat · coronal · right · 4.0mm · 0.70mm/px · 6 of 25 slices shown (3 of 3)]
[im 1/25]
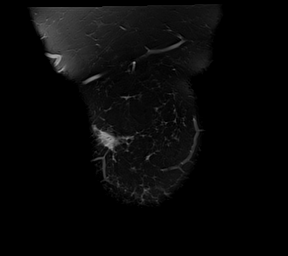
[im 5/25]
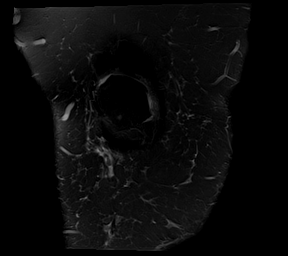
[im 10/25]
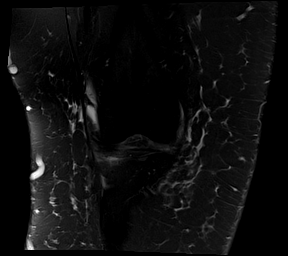
[im 15/25]
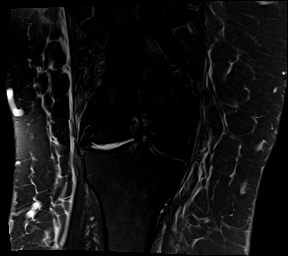
[im 20/25]
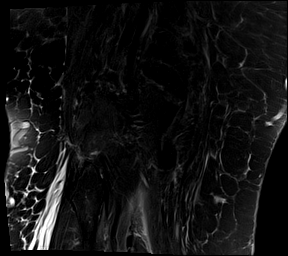
[im 25/25]
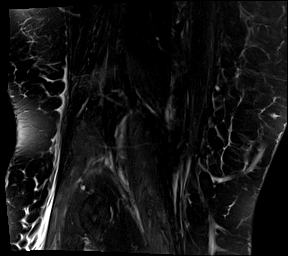

[Series 14: PD fat-sat · coronal · right · 3.0mm · 0.56mm/px · 8 of 35 slices shown (1 of 2)]
[im 1/35]
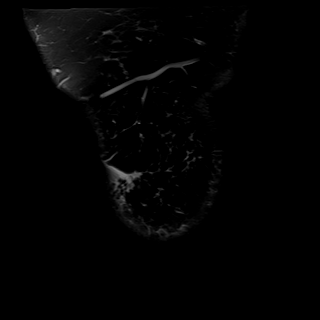
[im 5/35]
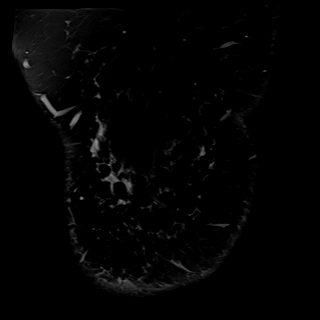
[im 10/35]
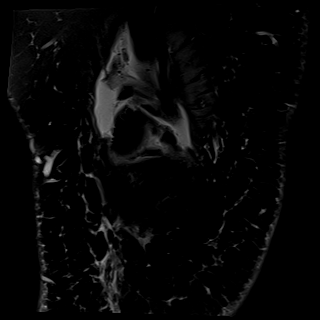
[im 15/35]
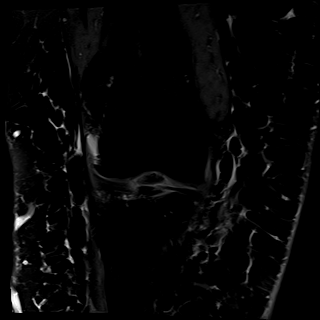
[im 20/35]
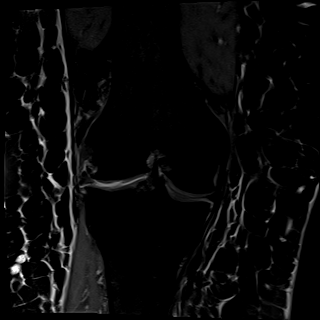
[im 25/35]
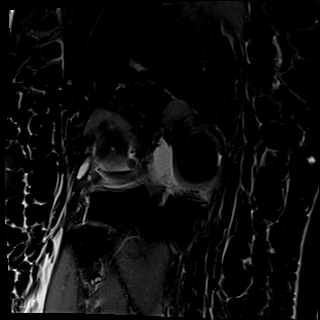
[im 30/35]
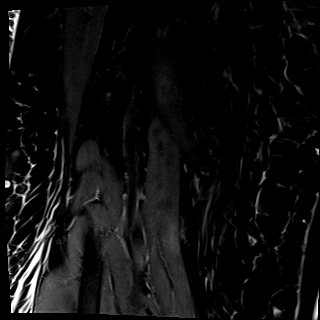
[im 35/35]
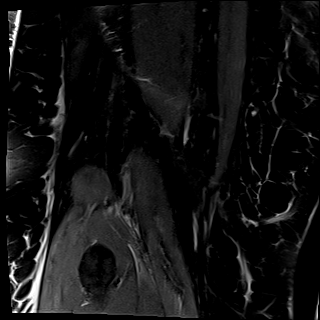

[Series 15: PD fat-sat · sagittal · right · 4.0mm · 0.61mm/px · 6 of 26 slices shown (2 of 2)]
[im 1/26]
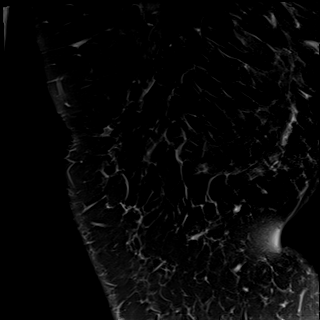
[im 6/26]
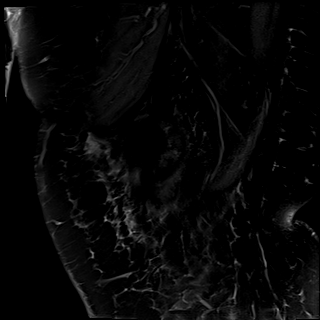
[im 11/26]
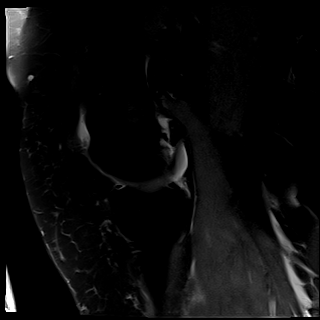
[im 16/26]
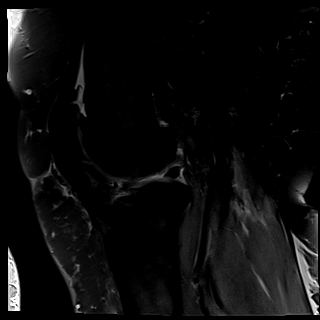
[im 21/26]
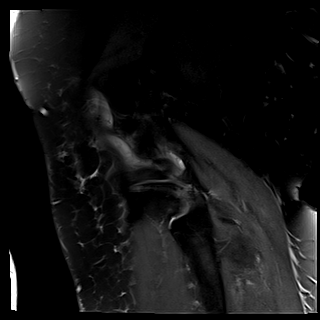
[im 26/26]
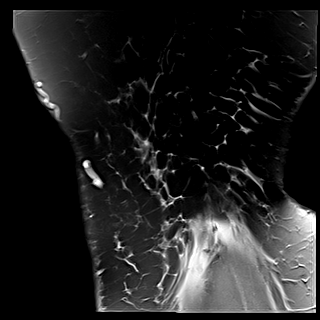

[38 of 40 positions shown; findings below may reference images not displayed]

FINDINGS: Technical note: Despite efforts by the technologist and patient,
motion artifact is present on today's exam and could not be
eliminated. This reduces exam sensitivity and specificity.

MENISCI

Medial meniscus:  Intact.

Lateral meniscus: Intrasubstance degeneration without discrete tear.

LIGAMENTS

Cruciates:  Intact ACL and PCL.

Collaterals: Medial collateral ligament is intact. Lateral
collateral ligament complex is intact.

CARTILAGE

Patellofemoral: Diffuse patellofemoral compartment chondral
thinning.

Medial: Mild partial-thickness cartilage loss of the medial
femorotibial compartment.

Lateral: Irregular partial-thickness cartilage defect of the central
weight-bearing lateral femoral condyle measuring up to 9 mm in
diameter with areas of full-thickness chondral fissuring along the
posterior margin of the defect (series 14, images 13-15). There is
chondral thinning and partial-thickness fissuring of the lateral
tibial plateau.

Joint:  Small knee joint effusion.  Fat pads within normal limits.

Popliteal Fossa:  No Baker cyst. Intact popliteus tendon.

Extensor Mechanism:  Intact quadriceps tendon and patellar tendon.

Bones: Pedunculated osteochondroma emanating from the posterior
cortex of the proximal fibular metaphysis measuring approximately
3.7 x 2.3 x 2.8 cm. Thin overlying T2 hyperintense cartilaginous cap
measuring approximately 2 mm in thickness (series 12, images 23-24).
No fracture. No malalignment. No suspicious marrow replacing bone
lesion.

Other: Intramuscular edema within the visualized medial and lateral
heads of the gastrocnemius muscle. Mild circumferential subcutaneous
edema, nonspecific.
IMPRESSION: 1. Pedunculated osteochondroma emanating from the posterior cortex
of the proximal fibular metaphysis measuring 3.7 x 2.3 x 2.8 cm with
thin overlying cartilaginous cap. No aggressive features by MRI.
2. Mild tricompartmental osteoarthritis with cartilage abnormalities
as described above.
3. Lateral meniscal degeneration without discrete tear.
4. Small knee joint effusion.
5. Intramuscular edema within the visualized medial and lateral
heads of the gastrocnemius muscle, which may reflect muscle strain.

## 2022-12-01 ENCOUNTER — Encounter: Payer: Self-pay | Admitting: Nurse Practitioner

## 2022-12-01 DIAGNOSIS — R5382 Chronic fatigue, unspecified: Secondary | ICD-10-CM

## 2022-12-01 DIAGNOSIS — R7989 Other specified abnormal findings of blood chemistry: Secondary | ICD-10-CM

## 2022-12-01 NOTE — Telephone Encounter (Signed)
Faxed lab orders to number provided.

## 2022-12-01 NOTE — Telephone Encounter (Signed)
Can you fax her lab orders?  I added CMP too

## 2022-12-03 ENCOUNTER — Ambulatory Visit: Payer: 59 | Admitting: Nurse Practitioner

## 2022-12-08 ENCOUNTER — Other Ambulatory Visit: Payer: 59

## 2022-12-08 DIAGNOSIS — E063 Autoimmune thyroiditis: Secondary | ICD-10-CM

## 2022-12-08 NOTE — Progress Notes (Signed)
Pt completed labs for outside provider. 

## 2022-12-09 LAB — COMPREHENSIVE METABOLIC PANEL
ALT: 17 IU/L (ref 0–32)
AST: 18 IU/L (ref 0–40)
Albumin/Globulin Ratio: 1.4
Albumin: 3.8 g/dL (ref 3.8–4.9)
Alkaline Phosphatase: 130 IU/L — ABNORMAL HIGH (ref 44–121)
BUN/Creatinine Ratio: 11 — ABNORMAL LOW (ref 12–28)
BUN: 8 mg/dL (ref 8–27)
Bilirubin Total: 0.3 mg/dL (ref 0.0–1.2)
CO2: 25 mmol/L (ref 20–29)
Calcium: 8.9 mg/dL (ref 8.7–10.3)
Chloride: 103 mmol/L (ref 96–106)
Creatinine, Ser: 0.71 mg/dL (ref 0.57–1.00)
Globulin, Total: 2.8 g/dL (ref 1.5–4.5)
Glucose: 79 mg/dL (ref 70–99)
Potassium: 4.3 mmol/L (ref 3.5–5.2)
Sodium: 142 mmol/L (ref 134–144)
Total Protein: 6.6 g/dL (ref 6.0–8.5)
eGFR: 97 mL/min/{1.73_m2} (ref >59)

## 2022-12-09 LAB — T3: T3, Total: 292 ng/dL — ABNORMAL HIGH (ref 71–180)

## 2022-12-09 LAB — TSH+FREE T4
Free T4: 1.09 ng/dL (ref 0.82–1.77)
TSH: 0.51 u[IU]/mL (ref 0.450–4.500)

## 2022-12-10 ENCOUNTER — Ambulatory Visit (INDEPENDENT_AMBULATORY_CARE_PROVIDER_SITE_OTHER): Payer: 59 | Admitting: Nurse Practitioner

## 2022-12-10 ENCOUNTER — Encounter: Payer: Self-pay | Admitting: Nurse Practitioner

## 2022-12-10 VITALS — BP 122/75 | HR 76 | Ht 67.0 in | Wt 328.0 lb

## 2022-12-10 DIAGNOSIS — E038 Other specified hypothyroidism: Secondary | ICD-10-CM

## 2022-12-10 DIAGNOSIS — E063 Autoimmune thyroiditis: Secondary | ICD-10-CM | POA: Diagnosis not present

## 2022-12-10 DIAGNOSIS — R5382 Chronic fatigue, unspecified: Secondary | ICD-10-CM | POA: Diagnosis not present

## 2022-12-10 DIAGNOSIS — R7989 Other specified abnormal findings of blood chemistry: Secondary | ICD-10-CM | POA: Diagnosis not present

## 2022-12-10 DIAGNOSIS — Z6841 Body Mass Index (BMI) 40.0 and over, adult: Secondary | ICD-10-CM | POA: Diagnosis not present

## 2022-12-10 MED ORDER — THYROID 120 MG PO TABS: 120.0000 mg | ORAL_TABLET | Freq: Every day | ORAL | 1 refills | Status: DC
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
## 2023-01-19 ENCOUNTER — Ambulatory Visit: Admission: RE | Admit: 2023-01-19 | Payer: 59 | Source: Ambulatory Visit

## 2023-01-19 DIAGNOSIS — Z1231 Encounter for screening mammogram for malignant neoplasm of breast: Secondary | ICD-10-CM

## 2023-03-03 ENCOUNTER — Encounter: Payer: Self-pay | Admitting: Nurse Practitioner

## 2023-03-04 MED ORDER — ZEPBOUND 2.5 MG/0.5ML ~~LOC~~ SOAJ: 2.5000 mg | SUBCUTANEOUS | 0 refills | Status: DC

## 2023-03-11 ENCOUNTER — Telehealth: Payer: Self-pay

## 2023-03-11 ENCOUNTER — Encounter: Payer: Self-pay | Admitting: Nurse Practitioner

## 2023-03-11 NOTE — Telephone Encounter (Signed)
There is a PA in the fax queue

## 2023-03-11 NOTE — Telephone Encounter (Signed)
Pharmacy Patient Advocate Encounter   Received notification from Patient Advice Request messages that prior authorization for Zepbound is required/requested.  Per test claim: PA required; PA submitted to [facility] via CoverMyMeds Key/confirmation #/EOC [id number] Status is pending

## 2023-03-11 NOTE — Telephone Encounter (Signed)
Can you please check on PA for Zepbound?  See patient message.

## 2023-03-11 NOTE — Telephone Encounter (Signed)
Patient was called and a message was left  about the approval of the Zepbound.

## 2023-03-11 NOTE — Telephone Encounter (Signed)
Can you fax her recent lab orders to [facility]?  The number is in the message line.

## 2023-03-11 NOTE — Telephone Encounter (Signed)
Pharmacy Patient Advocate Encounter  Received notification from [facility] that Prior Authorization for Zepbound has been APPROVED through 11/06/2023  PA #/Case ID/Reference #: [id number]

## 2023-03-12 ENCOUNTER — Other Ambulatory Visit: Payer: Self-pay

## 2023-03-12 DIAGNOSIS — E063 Autoimmune thyroiditis: Secondary | ICD-10-CM

## 2023-03-12 NOTE — Progress Notes (Signed)
Pt completed required labs for endocrinology. Heather Farley

## 2023-03-15 LAB — CMP12+LP+TP+TSH+6AC+CBC/D/PLT
ALT: 17 IU/L (ref 0–32)
AST: 16 IU/L (ref 0–40)
Albumin: 3.6 g/dL — ABNORMAL LOW (ref 3.8–4.9)
Alkaline Phosphatase: 123 IU/L — ABNORMAL HIGH (ref 44–121)
BUN/Creatinine Ratio: 8 — ABNORMAL LOW (ref 12–28)
BUN: 7 mg/dL — ABNORMAL LOW (ref 8–27)
Basophils Absolute: 0.1 10*3/uL (ref 0.0–0.2)
Basos: 1 %
Bilirubin Total: 0.4 mg/dL (ref 0.0–1.2)
Calcium: 8.7 mg/dL (ref 8.7–10.3)
Chloride: 104 mmol/L (ref 96–106)
Chol/HDL Ratio: 5.1 ratio — ABNORMAL HIGH (ref 0.0–4.4)
Cholesterol, Total: 193 mg/dL (ref 100–199)
Creatinine, Ser: 0.83 mg/dL (ref 0.57–1.00)
EOS (ABSOLUTE): 0.4 10*3/uL (ref 0.0–0.4)
Eos: 7 %
Estimated CHD Risk: 1.3 times avg. — ABNORMAL HIGH (ref 0.0–1.0)
Free Thyroxine Index: 1.7 (ref 1.2–4.9)
GGT: 22 IU/L (ref 0–60)
Globulin, Total: 2.8 g/dL (ref 1.5–4.5)
Glucose: 80 mg/dL (ref 70–99)
HDL: 38 mg/dL — ABNORMAL LOW (ref >39)
Hematocrit: 44.3 % (ref 34.0–46.6)
Hemoglobin: 14.2 g/dL (ref 11.1–15.9)
Immature Grans (Abs): 0 10*3/uL (ref 0.0–0.1)
Immature Granulocytes: 0 %
Iron: 94 ug/dL (ref 27–159)
LDH: 212 IU/L (ref 119–226)
LDL Chol Calc (NIH): 136 mg/dL — ABNORMAL HIGH (ref 0–99)
Lymphocytes Absolute: 1.9 10*3/uL (ref 0.7–3.1)
Lymphs: 33 %
MCH: 27.8 pg (ref 26.6–33.0)
MCHC: 32.1 g/dL (ref 31.5–35.7)
MCV: 87 fL (ref 79–97)
Monocytes Absolute: 0.4 10*3/uL (ref 0.1–0.9)
Monocytes: 7 %
Neutrophils Absolute: 3 10*3/uL (ref 1.4–7.0)
Neutrophils: 52 %
Phosphorus: 3.3 mg/dL (ref 3.0–4.3)
Platelets: 203 10*3/uL (ref 150–450)
Potassium: 4.5 mmol/L (ref 3.5–5.2)
RBC: 5.1 x10E6/uL (ref 3.77–5.28)
RDW: 13 % (ref 11.7–15.4)
Sodium: 141 mmol/L (ref 134–144)
T3 Uptake Ratio: 25 % (ref 24–39)
T4, Total: 6.7 ug/dL (ref 4.5–12.0)
TSH: 1.83 u[IU]/mL (ref 0.450–4.500)
Total Protein: 6.4 g/dL (ref 6.0–8.5)
Triglycerides: 103 mg/dL (ref 0–149)
Uric Acid: 5 mg/dL (ref 3.0–7.2)
VLDL Cholesterol Cal: 19 mg/dL (ref 5–40)
WBC: 5.9 10*3/uL (ref 3.4–10.8)
eGFR: 81 mL/min/{1.73_m2} (ref >59)

## 2023-03-15 LAB — VITAMIN B12: Vitamin B-12: 1052 pg/mL (ref 232–1245)

## 2023-03-15 LAB — CORTISOL-AM, BLOOD: Cortisol - AM: 9.7 ug/dL (ref 6.2–19.4)

## 2023-03-15 LAB — T4, FREE: Free T4: 0.88 ng/dL (ref 0.82–1.77)

## 2023-03-17 ENCOUNTER — Encounter: Payer: Self-pay | Admitting: Nurse Practitioner

## 2023-03-17 ENCOUNTER — Ambulatory Visit (INDEPENDENT_AMBULATORY_CARE_PROVIDER_SITE_OTHER): Payer: 59 | Admitting: Nurse Practitioner

## 2023-03-17 VITALS — BP 134/78 | HR 72 | Ht 67.0 in | Wt 325.8 lb

## 2023-03-17 DIAGNOSIS — E038 Other specified hypothyroidism: Secondary | ICD-10-CM

## 2023-03-17 DIAGNOSIS — E063 Autoimmune thyroiditis: Secondary | ICD-10-CM | POA: Diagnosis not present

## 2023-03-17 DIAGNOSIS — R5382 Chronic fatigue, unspecified: Secondary | ICD-10-CM | POA: Diagnosis not present

## 2023-03-17 DIAGNOSIS — Z6841 Body Mass Index (BMI) 40.0 and over, adult: Secondary | ICD-10-CM | POA: Diagnosis not present

## 2023-03-17 LAB — POCT GLYCOSYLATED HEMOGLOBIN (HGB A1C): Hemoglobin A1C: 5.3 % — AB (ref 4.0–5.6)

## 2023-03-17 MED ORDER — THYROID 120 MG PO TABS: 120.0000 mg | ORAL_TABLET | Freq: Every day | ORAL | 1 refills | Status: DC

## 2023-03-17 MED ORDER — ZEPBOUND 5 MG/0.5ML ~~LOC~~ SOAJ: 5.0000 mg | SUBCUTANEOUS | 1 refills | Status: DC
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
## 2023-05-13 ENCOUNTER — Encounter: Payer: Self-pay | Admitting: Nurse Practitioner

## 2023-05-13 MED ORDER — ZEPBOUND 5 MG/0.5ML ~~LOC~~ SOAJ: 5.0000 mg | SUBCUTANEOUS | 2 refills | Status: DC

## 2023-07-07 ENCOUNTER — Encounter: Payer: Self-pay | Admitting: Nurse Practitioner

## 2023-07-07 ENCOUNTER — Other Ambulatory Visit: Payer: Self-pay | Admitting: Physician Assistant

## 2023-07-07 DIAGNOSIS — E063 Autoimmune thyroiditis: Secondary | ICD-10-CM

## 2023-07-07 DIAGNOSIS — E66813 Obesity, class 3: Secondary | ICD-10-CM

## 2023-07-07 MED ORDER — ZEPBOUND 5 MG/0.5ML ~~LOC~~ SOAJ: 5.0000 mg | SUBCUTANEOUS | 2 refills | Status: DC

## 2023-07-07 NOTE — Progress Notes (Signed)
 Pt completed labs for endo. Heather Farley

## 2023-07-07 NOTE — Telephone Encounter (Signed)
 See patient message regarding faxing her lab order.

## 2023-07-08 LAB — T3: T3, Total: 222 ng/dL — ABNORMAL HIGH (ref 71–180)

## 2023-07-08 LAB — TSH+FREE T4
Free T4: 1.13 ng/dL (ref 0.82–1.77)
TSH: 0.058 u[IU]/mL — ABNORMAL LOW (ref 0.450–4.500)

## 2023-07-10 NOTE — Telephone Encounter (Signed)
 Have we gotten any request for more information for her Zepbound?

## 2023-07-13 ENCOUNTER — Other Ambulatory Visit (HOSPITAL_COMMUNITY): Payer: Self-pay

## 2023-07-13 ENCOUNTER — Telehealth: Payer: Self-pay

## 2023-07-13 MED ORDER — TIRZEPATIDE-WEIGHT MANAGEMENT 5 MG/0.5ML ~~LOC~~ SOLN: 5.0000 mg | SUBCUTANEOUS | 3 refills | Status: DC

## 2023-07-13 NOTE — Addendum Note (Signed)
 Addended by: [name] on: 07/13/2023 02:22 PM   Modules accepted: Orders

## 2023-07-13 NOTE — Telephone Encounter (Signed)
 I have talked with the patient. She states that [facility] had notified her that they did not support the weight loss medications, and unless things have changes [facility] [place] is not supporting this either. Please advise. Patient states that she had received a letter that she would not be able to get this medication.

## 2023-07-13 NOTE — Telephone Encounter (Signed)
 Pharmacy Patient Advocate Encounter   Received notification from Patient Advice Request messages that prior authorization for Zepbound  is required/requested.   Insurance verification completed.   The patient is insured through [employer] .   Per test claim: The current 30 day co-pay is, $45.  No PA needed at this time. This test claim was processed through [facility]- copay amounts may vary at other pharmacies due to pharmacy/plan contracts, or as the patient moves through the different stages of their insurance plan.

## 2023-07-17 ENCOUNTER — Encounter: Payer: Self-pay | Admitting: Nurse Practitioner

## 2023-07-17 ENCOUNTER — Ambulatory Visit (INDEPENDENT_AMBULATORY_CARE_PROVIDER_SITE_OTHER): Payer: 59 | Admitting: Nurse Practitioner

## 2023-07-17 VITALS — BP 138/79 | HR 85 | Ht 67.0 in | Wt 306.8 lb

## 2023-07-17 DIAGNOSIS — Z6841 Body Mass Index (BMI) 40.0 and over, adult: Secondary | ICD-10-CM | POA: Diagnosis not present

## 2023-07-17 DIAGNOSIS — E063 Autoimmune thyroiditis: Secondary | ICD-10-CM | POA: Diagnosis not present

## 2023-07-17 DIAGNOSIS — E66813 Obesity, class 3: Secondary | ICD-10-CM | POA: Diagnosis not present

## 2023-07-17 MED ORDER — THYROID 120 MG PO TABS: 120.0000 mg | ORAL_TABLET | Freq: Every day | ORAL | 1 refills | Status: DC

## 2023-07-17 NOTE — Patient Instructions (Signed)
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
## 2023-08-11 ENCOUNTER — Encounter: Payer: Self-pay | Admitting: Nurse Practitioner

## 2023-08-11 ENCOUNTER — Other Ambulatory Visit: Payer: Self-pay | Admitting: Nurse Practitioner

## 2023-08-11 DIAGNOSIS — G4733 Obstructive sleep apnea (adult) (pediatric): Secondary | ICD-10-CM

## 2023-08-11 MED ORDER — ZEPBOUND 2.5 MG/0.5ML ~~LOC~~ SOAJ: 2.5000 mg | SUBCUTANEOUS | 0 refills | Status: DC

## 2023-08-17 ENCOUNTER — Ambulatory Visit: Payer: Self-pay

## 2023-08-17 DIAGNOSIS — Z0000 Encounter for general adult medical examination without abnormal findings: Secondary | ICD-10-CM

## 2023-08-17 LAB — POCT URINALYSIS DIPSTICK
Bilirubin, UA: NEGATIVE
Blood, UA: POSITIVE
Glucose, UA: NEGATIVE
Ketones, UA: NEGATIVE
Leukocytes, UA: NEGATIVE
Nitrite, UA: NEGATIVE
Protein, UA: NEGATIVE
Spec Grav, UA: 1.015 (ref 1.010–1.025)
Urobilinogen, UA: 0.2 U/dL
pH, UA: 7 (ref 5.0–8.0)

## 2023-08-18 ENCOUNTER — Telehealth: Payer: Self-pay

## 2023-08-18 ENCOUNTER — Other Ambulatory Visit: Payer: Self-pay | Admitting: Nurse Practitioner

## 2023-08-18 ENCOUNTER — Other Ambulatory Visit (HOSPITAL_COMMUNITY): Payer: Self-pay

## 2023-08-18 LAB — CMP12+LP+TP+TSH+6AC+CBC/D/PLT
ALT: 16 [IU]/L (ref 0–32)
AST: 14 [IU]/L (ref 0–40)
Albumin: 3.6 g/dL — ABNORMAL LOW (ref 3.9–4.9)
Alkaline Phosphatase: 129 [IU]/L — ABNORMAL HIGH (ref 44–121)
BUN/Creatinine Ratio: 16 (ref 12–28)
BUN: 11 mg/dL (ref 8–27)
Basophils Absolute: 0 10*3/uL (ref 0.0–0.2)
Basos: 1 %
Bilirubin Total: 0.5 mg/dL (ref 0.0–1.2)
Calcium: 8.9 mg/dL (ref 8.7–10.3)
Chloride: 103 mmol/L (ref 96–106)
Chol/HDL Ratio: 4.5 {ratio} — ABNORMAL HIGH (ref 0.0–4.4)
Cholesterol, Total: 176 mg/dL (ref 100–199)
Creatinine, Ser: 0.67 mg/dL (ref 0.57–1.00)
EOS (ABSOLUTE): 0.3 10*3/uL (ref 0.0–0.4)
Eos: 6 %
Estimated CHD Risk: 1.1 times avg. — ABNORMAL HIGH (ref 0.0–1.0)
Free Thyroxine Index: 2.1 (ref 1.2–4.9)
GGT: 27 [IU]/L (ref 0–60)
Globulin, Total: 2.7 g/dL (ref 1.5–4.5)
Glucose: 85 mg/dL (ref 70–99)
HDL: 39 mg/dL — ABNORMAL LOW (ref >39)
Hematocrit: 41.8 % (ref 34.0–46.6)
Hemoglobin: 13.8 g/dL (ref 11.1–15.9)
Immature Grans (Abs): 0 10*3/uL (ref 0.0–0.1)
Immature Granulocytes: 0 %
Iron: 99 ug/dL (ref 27–139)
LDH: 173 [IU]/L (ref 119–226)
LDL Chol Calc (NIH): 118 mg/dL — ABNORMAL HIGH (ref 0–99)
Lymphocytes Absolute: 1.9 10*3/uL (ref 0.7–3.1)
Lymphs: 34 %
MCH: 28.1 pg (ref 26.6–33.0)
MCHC: 33 g/dL (ref 31.5–35.7)
MCV: 85 fL (ref 79–97)
Monocytes Absolute: 0.4 10*3/uL (ref 0.1–0.9)
Monocytes: 8 %
Neutrophils Absolute: 2.9 10*3/uL (ref 1.4–7.0)
Neutrophils: 51 %
Phosphorus: 3.8 mg/dL (ref 3.0–4.3)
Platelets: 206 10*3/uL (ref 150–450)
Potassium: 4.7 mmol/L (ref 3.5–5.2)
RBC: 4.91 x10E6/uL (ref 3.77–5.28)
RDW: 13.1 % (ref 11.7–15.4)
Sodium: 140 mmol/L (ref 134–144)
T3 Uptake Ratio: 27 % (ref 24–39)
T4, Total: 7.8 ug/dL (ref 4.5–12.0)
TSH: 0.164 u[IU]/mL — ABNORMAL LOW (ref 0.450–4.500)
Total Protein: 6.3 g/dL (ref 6.0–8.5)
Triglycerides: 103 mg/dL (ref 0–149)
Uric Acid: 4.4 mg/dL (ref 3.0–7.2)
VLDL Cholesterol Cal: 19 mg/dL (ref 5–40)
WBC: 5.6 10*3/uL (ref 3.4–10.8)
eGFR: 99 mL/min/{1.73_m2} (ref >59)

## 2023-08-18 NOTE — Telephone Encounter (Signed)
 Pharmacy Patient Advocate Encounter   Received notification from Patient Advice Request messages that prior authorization for Zepbound is required/requested.   Insurance verification completed.   The patient is insured through [employer] .   Per test claim: Product/Service Not covered. Plan exclusion.

## 2023-08-18 NOTE — Telephone Encounter (Signed)
 I sent in for Zepbound on grounds of her obstructive sleep apnea (which Zepbound recently got FDA approval for).  Can we try running the PA through that way please?  Thanks

## 2023-08-20 NOTE — Telephone Encounter (Signed)
 Patient was called and made aware.

## 2023-08-24 ENCOUNTER — Encounter: Payer: Self-pay | Admitting: Physician Assistant

## 2023-08-24 ENCOUNTER — Ambulatory Visit: Payer: Self-pay | Admitting: Physician Assistant

## 2023-08-24 VITALS — BP 145/79 | HR 68 | Temp 97.0°F | Resp 12 | Ht 67.0 in | Wt 311.0 lb

## 2023-08-24 DIAGNOSIS — Z0000 Encounter for general adult medical examination without abnormal findings: Secondary | ICD-10-CM

## 2023-08-24 NOTE — Progress Notes (Signed)
 [facility] [place] [facility] ____________________________________________   None    (approximate)  I have reviewed the triage vital signs and the nursing notes.   HISTORY  Chief Complaint No chief complaint on file.   HPI Heather Farley is a 63 y.o. female patient presents for annual physical exam.         Past Medical History:  Diagnosis Date   Osteoporosis    Thyroid disease     Patient Active Problem List   Diagnosis Date Noted   B12 deficiency 07/31/2020   Vitamin D deficiency 07/31/2020   Hypothyroidism 03/11/2018    Past Surgical History:  Procedure Laterality Date   ABDOMINAL HYSTERECTOMY     FOOT SURGERY Left    2018   LASIK     1995   SHOULDER SURGERY Left     Prior to Admission medications   Medication Sig Start Date End Date Taking? Authorizing Provider  albuterol (VENTOLIN HFA) 108 (90 Base) MCG/ACT inhaler Inhale 2 puffs into the lungs every 6 (six) hours as needed for wheezing or shortness of breath. Patient not taking: Reported on 07/17/2023 07/03/22   Joni Reining, PA-C  ARMOUR THYROID 120 MG tablet TAKE 1 TABLET DAILY BEFORE BREAKFAST 08/18/23   [name], NP  Cholecalciferol 125 MCG (5000 UT) TABS Take by mouth. Patient not taking: Reported on 07/17/2023 11/19/20   [provider]  Cyanocobalamin (VITAMIN B-12) 1000 MCG SUBL Place under the tongue. Patient not taking: Reported on 07/17/2023    [provider]  Menaquinone-7 (VITAMIN K2 PO) Take by mouth daily. Patient not taking: Reported on 07/17/2023    [provider]  Probiotic Product (UP4 PROBIOTICS PO) Take by mouth daily at 6 (six) AM. Patient not taking: Reported on 07/17/2023    [provider]  tirzepatide (ZEPBOUND) 2.5 MG/0.5ML Pen Inject 2.5 mg into the skin once a week. 08/11/23   [name], NP  vitamin C (ASCORBIC ACID) 500 MG tablet Take 500 mg by mouth daily. Patient not taking: Reported on 03/17/2023     [provider]    Allergies Chicken allergy, Codeine, Cayenne, Soy allergy (obsolete), Hydrocodone-acetaminophen, and Hydrocodone-acetaminophen  Family History  Problem Relation Age of Onset   Healthy Mother    Healthy Father    Breast cancer Neg Hx     Social History Social History   Tobacco Use   Smoking status: Never   Smokeless tobacco: Never  Vaping Use   Vaping status: Never Used  Substance Use Topics   Alcohol use: No   Drug use: No    Review of Systems Constitutional: No fever/chills Eyes: No visual changes. ENT: No sore throat. Cardiovascular: Denies chest pain. Respiratory: Denies shortness of breath. Gastrointestinal: No abdominal pain.  No nausea, no vomiting.  No diarrhea.  No constipation. Genitourinary: Negative for dysuria. Musculoskeletal: Negative for back pain. Skin: Negative for rash. Neurological: Negative for headaches, focal weakness or numbness. Endocrine: Hypothyroidism Hematological/Lymphatic:  Allergic/Immunilogical: Chicken allergies, codeine, and soy allergies.  ____________________________________________   PHYSICAL EXAM:  VITAL SIGNS: BP 145/79BP. 145/79. Data is abnormal. Taken on 08/24/23 10:03 AM  BP Location Left Arm  Patient Position Sitting  Cuff Size Large  Pulse Rate 68  Temp 97 F (36.1 C)Temp. 97 F (36.1 C). Data is abnormal. Taken on 08/24/23 10:03 AM  Temp Source Temporal  Weight 311 lb (141.1 kg)Weight. 311 lb (141.1 kg). Data is abnormal. Taken on 08/24/23 10:03 AM  Height 5\' 7"  (1.702  m)  Resp 12  SpO2 100 %   BMI: 48.71 kg/m2  BSA: 2.58 m2   Constitutional: Alert and oriented. Well appearing and in no acute distress. Eyes: Conjunctivae are normal. PERRL. EOMI. Head: Atraumatic. Nose: No congestion/rhinnorhea. Mouth/Throat: Mucous membranes are moist.  Oropharynx non-erythematous. Neck: No stridor.  No cervical spine tenderness to palpation. Hematological/Lymphatic/Immunilogical: No cervical  lymphadenopathy. Cardiovascular: Normal rate, regular rhythm. Grossly normal heart sounds.  Good peripheral circulation. Respiratory: Normal respiratory effort.  No retractions. Lungs CTAB. Gastrointestinal: Soft and nontender. No distention. No abdominal bruits. No CVA tenderness. Genitourinary: Deferred Musculoskeletal: No lower extremity tenderness nor edema.  No joint effusions. Neurologic:  Normal speech and language. No gross focal neurologic deficits are appreciated. No gait instability. Skin:  Skin is warm, dry and intact. No rash noted. Psychiatric: Mood and affect are normal. Speech and behavior are normal.  ____________________________________________   LABS       Component Ref Range & Units (hover) 7 d ago 1 yr ago 2 yr ago  Color, UA Yellow Yellow yellow  Clarity, UA Clear Clear clear  Glucose, UA Negative Negative Negative  Bilirubin, UA Negative Negative negative  Ketones, UA Negative Negative negative  Spec Grav, UA 1.015 1.020 1.015  Blood, UA Positive Negative Trace CM  Comment: 1+  pH, UA 7.0 6.0 6.5  Protein, UA Negative Negative Negative  Urobilinogen, UA 0.2 0.2 0.2  Nitrite, UA Negative Negative negative  Leukocytes, UA Negative Negative Negative  Appearance   medium  Odor                View All Conversations on this Encounter               Component Ref Range & Units (hover) 7 d ago (08/17/23) 1 mo ago (07/07/23) 5 mo ago (03/12/23) 8 mo ago (12/08/22) 8 mo ago (12/08/22) 10 mo ago (10/10/22) 1 yr ago (08/12/22)  Glucose 85  80  79  91  Uric Acid 4.4  5.0 CM    5.0 CM  Comment:            Therapeutic target for gout patients: <6.0  BUN 11  7 Low   8  7 Low   Creatinine, Ser 0.67  0.83  0.71  0.68  eGFR 99  81  97 82.0 R, CM 100  BUN/Creatinine Ratio 16  8 Low   11 Low   10 Low   Sodium 140  141  142  141  Potassium 4.7  4.5  4.3  4.4  Chloride 103  104  103  104  Calcium 8.9  8.7  8.9  9.1  Phosphorus 3.8  3.3    3.7  Total Protein 6.3   6.4  6.6  6.5  Albumin 3.6 Low   3.6 Low  R  3.8 R  3.7 Low  R  Globulin, Total 2.7  2.8  2.8  2.8  Bilirubin Total 0.5  0.4  0.3  0.4  Alkaline Phosphatase 129 High   123 High   130 High   131 High   LDH 173  212    198  AST 14  16  18  11   ALT 16  17  17  13   GGT 27  22    23   Iron 99  94 R    82 R  Cholesterol, Total 176  193    186  Triglycerides 103  103    99  HDL 39 Low  38 Low     39 Low   VLDL Cholesterol Cal 19  19    18   LDL Chol Calc (NIH) 118 High   136 High     129 High   Chol/HDL Ratio 4.5 High   5.1 High  CM    4.8 High  CM  Comment:                                   T. Chol/HDL Ratio                                             Men  Women                               1/2 Avg.Risk  3.4    3.3                                   Avg.Risk  5.0    4.4                                2X Avg.Risk  9.6    7.1                                3X Avg.Risk 23.4   11.0  Estimated CHD Risk 1.1 High   1.3 High  CM    1.2 High  CM  Comment: The CHD Risk is based on the T. Chol/HDL ratio. Other factors affect CHD Risk such as hypertension, smoking, diabetes, severe obesity, and family history of premature CHD.  TSH 0.164 Low  0.058 Low  1.830 0.510   0.006 Low   T4, Total 7.8  6.7    15.1 High   T3 Uptake Ratio 27  25    37  Free Thyroxine Index 2.1  1.7    5.6 High   WBC 5.6  5.9    6.4  RBC 4.91  5.10    5.08  Hemoglobin 13.8  14.2    13.8  Hematocrit 41.8  44.3    41.8  MCV 85  87    82  MCH 28.1  27.8    27.2  MCHC 33.0  32.1    33.0  RDW 13.1  13.0    13.2  Platelets 206  203    250  Neutrophils 51  52    57  Lymphs 34  33    30  Monocytes 8  7    6   Eos 6  7    6   Basos 1  1    1   Neutrophils Absolute 2.9  3.0    3.7  Lymphocytes Absolute 1.9  1.9    1.9  Monocytes Absolute 0.4  0.4    0.4  EOS (ABSOLUTE) 0.3  0.4    0.4  Basophils Absolute 0.0  0.1    0.0  Immature Granulocytes 0  0    0  Immature Grans (Abs) 0.0  0.0    0.0  ____________________________________________  EKG Normal sinus rhythm at 66 bpm  ____________________________________________    ____________________________________________   INITIAL IMPRESSION / ASSESSMENT AND PLAN  As part of my medical decision making, I reviewed the following data within the electronic MEDICAL RECORD NUMBER       No acute findings include exam and EKG.  Labs continue to show low TSH.  Patient will follow-up with endocrinologist     ____________________________________________   FINAL CLINICAL IMPRESSION 1 exam    ED Discharge Orders     None        Note:  This document was prepared using Dragon voice recognition software and may include unintentional dictation errors.

## 2023-11-17 ENCOUNTER — Encounter: Payer: Self-pay | Admitting: Nurse Practitioner

## 2023-11-20 ENCOUNTER — Ambulatory Visit: Payer: 59 | Admitting: Nurse Practitioner

## 2023-11-27 ENCOUNTER — Ambulatory Visit: Admitting: Nurse Practitioner

## 2023-12-25 ENCOUNTER — Encounter: Payer: Self-pay | Admitting: Physician Assistant

## 2024-01-05 ENCOUNTER — Other Ambulatory Visit: Payer: Self-pay | Admitting: Physician Assistant

## 2024-01-05 DIAGNOSIS — Z1231 Encounter for screening mammogram for malignant neoplasm of breast: Secondary | ICD-10-CM

## 2024-01-07 ENCOUNTER — Other Ambulatory Visit: Payer: Self-pay | Admitting: Physician Assistant

## 2024-01-07 DIAGNOSIS — Z1231 Encounter for screening mammogram for malignant neoplasm of breast: Secondary | ICD-10-CM

## 2024-01-22 ENCOUNTER — Inpatient Hospital Stay: Admission: RE | Admit: 2024-01-22 | Source: Ambulatory Visit

## 2024-01-29 ENCOUNTER — Ambulatory Visit
Admission: RE | Admit: 2024-01-29 | Discharge: 2024-01-29 | Disposition: A | Discharge status: Home / Self Care | Source: Ambulatory Visit | Attending: Physician Assistant | Admitting: Physician Assistant

## 2024-01-29 ENCOUNTER — Encounter

## 2024-01-29 DIAGNOSIS — Z1231 Encounter for screening mammogram for malignant neoplasm of breast: Secondary | ICD-10-CM | POA: Diagnosis not present

## 2024-02-15 ENCOUNTER — Encounter: Payer: Self-pay | Admitting: Nurse Practitioner

## 2024-02-15 MED ORDER — THYROID 120 MG PO TABS: 120.0000 mg | ORAL_TABLET | Freq: Every day | ORAL | 1 refills | Status: DC

## 2024-03-01 ENCOUNTER — Telehealth: Payer: Self-pay | Admitting: Nurse Practitioner

## 2024-03-01 DIAGNOSIS — E063 Autoimmune thyroiditis: Secondary | ICD-10-CM

## 2024-03-01 NOTE — Telephone Encounter (Signed)
 Pt needs labs updated

## 2024-03-02 NOTE — Telephone Encounter (Signed)
Lab orders updated and sent to [place].

## 2024-03-03 ENCOUNTER — Other Ambulatory Visit: Payer: Self-pay

## 2024-03-03 DIAGNOSIS — E063 Autoimmune thyroiditis: Secondary | ICD-10-CM

## 2024-03-04 LAB — TSH+FREE T4
Free T4: 0.85 ng/dL (ref 0.82–1.77)
TSH: 1.2 u[IU]/mL (ref 0.450–4.500)

## 2024-03-04 LAB — T3: T3, Total: 175 ng/dL (ref 71–180)

## 2024-03-08 ENCOUNTER — Encounter: Payer: Self-pay | Admitting: Nurse Practitioner

## 2024-03-08 ENCOUNTER — Ambulatory Visit (INDEPENDENT_AMBULATORY_CARE_PROVIDER_SITE_OTHER): Admitting: Nurse Practitioner

## 2024-03-08 VITALS — BP 128/78 | HR 72 | Ht 67.0 in | Wt 314.8 lb

## 2024-03-08 DIAGNOSIS — Z6841 Body Mass Index (BMI) 40.0 and over, adult: Secondary | ICD-10-CM

## 2024-03-08 DIAGNOSIS — E66813 Obesity, class 3: Secondary | ICD-10-CM

## 2024-03-08 DIAGNOSIS — G4733 Obstructive sleep apnea (adult) (pediatric): Secondary | ICD-10-CM

## 2024-03-08 DIAGNOSIS — E063 Autoimmune thyroiditis: Secondary | ICD-10-CM

## 2024-03-08 MED ORDER — TIRZEPATIDE 2.5 MG/0.5ML ~~LOC~~ SOAJ: 2.5000 mg | SUBCUTANEOUS | 0 refills | Status: DC

## 2024-03-08 MED ORDER — EPINEPHRINE 0.3 MG/0.3ML IJ SOAJ: 0.3000 mg | INTRAMUSCULAR | 99 refills | Status: AC | PRN

## 2024-03-08 NOTE — Patient Instructions (Signed)
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
## 2024-03-09 ENCOUNTER — Encounter: Payer: Self-pay | Admitting: Pharmacy Technician

## 2024-03-09 ENCOUNTER — Other Ambulatory Visit (HOSPITAL_COMMUNITY): Payer: Self-pay

## 2024-03-09 ENCOUNTER — Telehealth: Payer: Self-pay | Admitting: Pharmacy Technician

## 2024-03-09 NOTE — Telephone Encounter (Signed)
 Will you call and let her know that I could not get the Mounjaro  approved with the sleep apnea diagnosis?  And ask her if she wants me to send in the Wegovy  self pay option?

## 2024-03-09 NOTE — Telephone Encounter (Signed)
 error

## 2024-03-09 NOTE — Telephone Encounter (Signed)
 Pharmacy Patient Advocate Encounter   Received notification from CoverMyMeds that prior authorization for Mounjaro  2.5MG /0.5ML auto-injectors  is required/requested.   Insurance verification completed.   The patient is insured through [employer] [facility] . Key: [id number]  Ozempic /Mounjaro  is approved exclusively as an adjunct to diet and exercise to improve glycemic control in adults with type 2 diabetes mellitus. A review of patient's medical chart reveals no documented diagnosis of type 2 diabetes or an A1C indicative of diabetes. Therefore, they do not currently meet the criteria for prior authorization of this medication.   If clinically appropriate, alternative options such as Saxenda, Zepbound , or Wegovy  may be considered for this patient.

## 2024-03-10 MED ORDER — SEMAGLUTIDE-WEIGHT MANAGEMENT 0.25 MG/0.5ML ~~LOC~~ SOAJ: 0.2500 mg | SUBCUTANEOUS | 1 refills | Status: AC

## 2024-03-10 MED ORDER — SEMAGLUTIDE-WEIGHT MANAGEMENT 2.4 MG/0.75ML ~~LOC~~ SOAJ: 2.4000 mg | SUBCUTANEOUS | 1 refills | Status: DC

## 2024-03-10 MED ORDER — SEMAGLUTIDE-WEIGHT MANAGEMENT 1.7 MG/0.75ML ~~LOC~~ SOAJ: 1.7000 mg | SUBCUTANEOUS | 1 refills | Status: AC

## 2024-03-10 MED ORDER — SEMAGLUTIDE-WEIGHT MANAGEMENT 0.5 MG/0.5ML ~~LOC~~ SOAJ: 0.5000 mg | SUBCUTANEOUS | 1 refills | Status: AC

## 2024-03-10 MED ORDER — SEMAGLUTIDE-WEIGHT MANAGEMENT 1 MG/0.5ML ~~LOC~~ SOAJ: 1.0000 mg | SUBCUTANEOUS | 1 refills | Status: AC

## 2024-03-10 NOTE — Telephone Encounter (Signed)
 I sent in Wegovy  to [facility] in [place] but they will deliver I believe and the pharmacist I hear is GREAT!  They should be contacting the patient to discuss the price.

## 2024-03-10 NOTE — Telephone Encounter (Signed)
 Talked with the patient. She states that they had already called her. They to not take SSA, and it will be $500.00 per month. Patient states that she will go for it.

## 2024-03-10 NOTE — Addendum Note (Signed)
 Addended by: Banesa Tristan J on: 03/10/2024 11:33 AM   Modules accepted: Orders

## 2024-03-10 NOTE — Telephone Encounter (Signed)
 Patient states that she is interested in the Wegovy  self pay option. She states that Benton was going to talk to the drug rep about the cost.

## 2024-05-03 DIAGNOSIS — M722 Plantar fascial fibromatosis: Secondary | ICD-10-CM | POA: Diagnosis not present

## 2024-05-03 DIAGNOSIS — M9904 Segmental and somatic dysfunction of sacral region: Secondary | ICD-10-CM | POA: Diagnosis not present

## 2024-05-03 DIAGNOSIS — M9905 Segmental and somatic dysfunction of pelvic region: Secondary | ICD-10-CM | POA: Diagnosis not present

## 2024-05-03 DIAGNOSIS — M9901 Segmental and somatic dysfunction of cervical region: Secondary | ICD-10-CM | POA: Diagnosis not present

## 2024-05-03 DIAGNOSIS — M9903 Segmental and somatic dysfunction of lumbar region: Secondary | ICD-10-CM | POA: Diagnosis not present

## 2024-05-03 DIAGNOSIS — M9902 Segmental and somatic dysfunction of thoracic region: Secondary | ICD-10-CM | POA: Diagnosis not present

## 2024-05-11 DIAGNOSIS — M9901 Segmental and somatic dysfunction of cervical region: Secondary | ICD-10-CM | POA: Diagnosis not present

## 2024-05-11 DIAGNOSIS — M9903 Segmental and somatic dysfunction of lumbar region: Secondary | ICD-10-CM | POA: Diagnosis not present

## 2024-05-11 DIAGNOSIS — M9902 Segmental and somatic dysfunction of thoracic region: Secondary | ICD-10-CM | POA: Diagnosis not present

## 2024-05-11 DIAGNOSIS — M9904 Segmental and somatic dysfunction of sacral region: Secondary | ICD-10-CM | POA: Diagnosis not present

## 2024-05-11 DIAGNOSIS — M9905 Segmental and somatic dysfunction of pelvic region: Secondary | ICD-10-CM | POA: Diagnosis not present

## 2024-05-11 DIAGNOSIS — M722 Plantar fascial fibromatosis: Secondary | ICD-10-CM | POA: Diagnosis not present

## 2024-05-16 DIAGNOSIS — M9901 Segmental and somatic dysfunction of cervical region: Secondary | ICD-10-CM | POA: Diagnosis not present

## 2024-05-16 DIAGNOSIS — M9905 Segmental and somatic dysfunction of pelvic region: Secondary | ICD-10-CM | POA: Diagnosis not present

## 2024-05-16 DIAGNOSIS — M722 Plantar fascial fibromatosis: Secondary | ICD-10-CM | POA: Diagnosis not present

## 2024-05-16 DIAGNOSIS — M9902 Segmental and somatic dysfunction of thoracic region: Secondary | ICD-10-CM | POA: Diagnosis not present

## 2024-05-16 DIAGNOSIS — M9904 Segmental and somatic dysfunction of sacral region: Secondary | ICD-10-CM | POA: Diagnosis not present

## 2024-05-16 DIAGNOSIS — M9903 Segmental and somatic dysfunction of lumbar region: Secondary | ICD-10-CM | POA: Diagnosis not present

## 2024-06-13 DIAGNOSIS — M25561 Pain in right knee: Secondary | ICD-10-CM | POA: Diagnosis not present

## 2024-06-13 DIAGNOSIS — M5431 Sciatica, right side: Secondary | ICD-10-CM | POA: Diagnosis not present

## 2024-06-13 DIAGNOSIS — M722 Plantar fascial fibromatosis: Secondary | ICD-10-CM | POA: Diagnosis not present

## 2024-06-13 DIAGNOSIS — M9903 Segmental and somatic dysfunction of lumbar region: Secondary | ICD-10-CM | POA: Diagnosis not present

## 2024-06-15 DIAGNOSIS — M25561 Pain in right knee: Secondary | ICD-10-CM | POA: Diagnosis not present

## 2024-06-15 DIAGNOSIS — M9903 Segmental and somatic dysfunction of lumbar region: Secondary | ICD-10-CM | POA: Diagnosis not present

## 2024-06-15 DIAGNOSIS — M5431 Sciatica, right side: Secondary | ICD-10-CM | POA: Diagnosis not present

## 2024-06-15 DIAGNOSIS — M722 Plantar fascial fibromatosis: Secondary | ICD-10-CM | POA: Diagnosis not present

## 2024-06-17 DIAGNOSIS — M722 Plantar fascial fibromatosis: Secondary | ICD-10-CM | POA: Diagnosis not present

## 2024-06-17 DIAGNOSIS — M9903 Segmental and somatic dysfunction of lumbar region: Secondary | ICD-10-CM | POA: Diagnosis not present

## 2024-06-17 DIAGNOSIS — M25561 Pain in right knee: Secondary | ICD-10-CM | POA: Diagnosis not present

## 2024-06-17 DIAGNOSIS — M5431 Sciatica, right side: Secondary | ICD-10-CM | POA: Diagnosis not present

## 2024-06-22 DIAGNOSIS — M9903 Segmental and somatic dysfunction of lumbar region: Secondary | ICD-10-CM | POA: Diagnosis not present

## 2024-06-22 DIAGNOSIS — M722 Plantar fascial fibromatosis: Secondary | ICD-10-CM | POA: Diagnosis not present

## 2024-06-22 DIAGNOSIS — M5431 Sciatica, right side: Secondary | ICD-10-CM | POA: Diagnosis not present

## 2024-06-22 DIAGNOSIS — M25561 Pain in right knee: Secondary | ICD-10-CM | POA: Diagnosis not present

## 2024-06-29 ENCOUNTER — Other Ambulatory Visit

## 2024-06-29 DIAGNOSIS — E063 Autoimmune thyroiditis: Secondary | ICD-10-CM

## 2024-07-02 LAB — COMPREHENSIVE METABOLIC PANEL WITH GFR
ALT: 17 IU/L (ref 0–32)
AST: 16 IU/L (ref 0–40)
Albumin: 3.7 g/dL — ABNORMAL LOW (ref 3.9–4.9)
Alkaline Phosphatase: 132 IU/L (ref 49–135)
BUN/Creatinine Ratio: 12 (ref 12–28)
BUN: 8 mg/dL (ref 8–27)
Bilirubin Total: 0.4 mg/dL (ref 0.0–1.2)
CO2: 23 mmol/L (ref 20–29)
Calcium: 8.7 mg/dL (ref 8.7–10.3)
Chloride: 103 mmol/L (ref 96–106)
Creatinine, Ser: 0.67 mg/dL (ref 0.57–1.00)
Globulin, Total: 2.5 g/dL (ref 1.5–4.5)
Glucose: 82 mg/dL (ref 70–99)
Potassium: 4.3 mmol/L (ref 3.5–5.2)
Sodium: 138 mmol/L (ref 134–144)
Total Protein: 6.2 g/dL (ref 6.0–8.5)
eGFR: 99 mL/min/1.73

## 2024-07-02 LAB — T3, FREE: T3, Free: 5.4 pg/mL — ABNORMAL HIGH (ref 2.0–4.4)

## 2024-07-02 LAB — T4, FREE: Free T4: 0.97 ng/dL (ref 0.82–1.77)

## 2024-07-02 LAB — TSH: TSH: 0.973 u[IU]/mL (ref 0.450–4.500)

## 2024-07-08 ENCOUNTER — Encounter: Payer: Self-pay | Admitting: Nurse Practitioner

## 2024-07-08 ENCOUNTER — Ambulatory Visit (INDEPENDENT_AMBULATORY_CARE_PROVIDER_SITE_OTHER): Admitting: Nurse Practitioner

## 2024-07-08 VITALS — BP 126/80 | HR 60 | Ht 67.0 in | Wt 313.0 lb

## 2024-07-08 DIAGNOSIS — E063 Autoimmune thyroiditis: Secondary | ICD-10-CM | POA: Diagnosis not present

## 2024-07-08 MED ORDER — THYROID 120 MG PO TABS: 120.0000 mg | ORAL_TABLET | Freq: Every day | ORAL | 1 refills | Status: AC
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
## 2025-01-06 ENCOUNTER — Ambulatory Visit: Admitting: Nurse Practitioner
